# Patient Record
Sex: Male | Born: 1955 | Hispanic: No | Marital: Married | Smoking: Never smoker
Health system: Southern US, Community
[De-identification: ages and names within clinical notes are randomized; demographics above are authoritative.]

## PROBLEM LIST (undated history)

## (undated) DIAGNOSIS — T7840XA Allergy, unspecified, initial encounter: Secondary | ICD-10-CM

## (undated) HISTORY — DX: Allergy, unspecified, initial encounter: T78.40XA

---

## 2010-03-15 ENCOUNTER — Observation Stay (HOSPITAL_COMMUNITY)
Admission: EM | Admit: 2010-03-15 | Discharge: 2010-03-16 | Payer: Self-pay | Source: Home / Self Care | Attending: Family Medicine | Admitting: Family Medicine

## 2010-03-15 LAB — RAPID URINE DRUG SCREEN, HOSP PERFORMED
Amphetamines: NOT DETECTED
Barbiturates: NOT DETECTED
Benzodiazepines: NOT DETECTED
Cocaine: NOT DETECTED
Opiates: NOT DETECTED
Tetrahydrocannabinol: NOT DETECTED

## 2010-03-15 LAB — CBC
HCT: 42.4 % (ref 39.0–52.0)
Hemoglobin: 14.8 g/dL (ref 13.0–17.0)
MCH: 30.3 pg (ref 26.0–34.0)
MCHC: 34.9 g/dL (ref 30.0–36.0)
MCV: 86.7 fL (ref 78.0–100.0)
Platelets: 255 10*3/uL (ref 150–400)
RBC: 4.89 MIL/uL (ref 4.22–5.81)
RDW: 11.4 % — ABNORMAL LOW (ref 11.5–15.5)
WBC: 8.5 10*3/uL (ref 4.0–10.5)

## 2010-03-15 LAB — URINALYSIS, ROUTINE W REFLEX MICROSCOPIC
Bilirubin Urine: NEGATIVE
Hemoglobin, Urine: NEGATIVE
Ketones, ur: NEGATIVE mg/dL
Nitrite: NEGATIVE
Protein, ur: NEGATIVE mg/dL
Specific Gravity, Urine: 1.028 (ref 1.005–1.030)
Urine Glucose, Fasting: NEGATIVE mg/dL
Urobilinogen, UA: 0.2 mg/dL (ref 0.0–1.0)
pH: 6 (ref 5.0–8.0)

## 2010-03-15 LAB — POCT I-STAT, CHEM 8
BUN: 19 mg/dL (ref 6–23)
Calcium, Ion: 1.09 mmol/L — ABNORMAL LOW (ref 1.12–1.32)
Chloride: 107 mEq/L (ref 96–112)
Creatinine, Ser: 1.1 mg/dL (ref 0.4–1.5)
Glucose, Bld: 123 mg/dL — ABNORMAL HIGH (ref 70–99)
HCT: 43 % (ref 39.0–52.0)
Hemoglobin: 14.6 g/dL (ref 13.0–17.0)
Potassium: 4.3 mEq/L (ref 3.5–5.1)
Sodium: 140 mEq/L (ref 135–145)
TCO2: 26 mmol/L (ref 0–100)

## 2010-03-15 LAB — POCT CARDIAC MARKERS
CKMB, poc: 1.4 ng/mL (ref 1.0–8.0)
Myoglobin, poc: 73.2 ng/mL (ref 12–200)
Troponin i, poc: <0.05 ng/mL (ref 0.00–0.09)

## 2010-03-15 LAB — PROTIME-INR
INR: 0.94 (ref 0.00–1.49)
Prothrombin Time: 12.8 seconds (ref 11.6–15.2)

## 2010-03-15 LAB — TROPONIN I: Troponin I: 0.03 ng/mL (ref 0.00–0.06)

## 2010-03-15 LAB — DIFFERENTIAL
Basophils Absolute: 0 10*3/uL (ref 0.0–0.1)
Basophils Relative: 0 % (ref 0–1)
Eosinophils Absolute: 0 10*3/uL (ref 0.0–0.7)
Eosinophils Relative: 0 % (ref 0–5)
Lymphocytes Relative: 18 % (ref 12–46)
Lymphs Abs: 1.5 10*3/uL (ref 0.7–4.0)
Monocytes Absolute: 0.3 10*3/uL (ref 0.1–1.0)
Monocytes Relative: 3 % (ref 3–12)
Neutro Abs: 6.7 10*3/uL (ref 1.7–7.7)
Neutrophils Relative %: 79 % — ABNORMAL HIGH (ref 43–77)

## 2010-03-15 LAB — CK TOTAL AND CKMB (NOT AT ARMC)
CK, MB: 2.8 ng/mL (ref 0.3–4.0)
Relative Index: 1.5 (ref 0.0–2.5)
Total CK: 188 U/L (ref 7–232)

## 2010-03-16 LAB — HEPATIC FUNCTION PANEL
ALT: 29 U/L (ref 0–53)
AST: 27 U/L (ref 0–37)
Albumin: 3.7 g/dL (ref 3.5–5.2)
Alkaline Phosphatase: 71 U/L (ref 39–117)
Bilirubin, Direct: 0.1 mg/dL (ref 0.0–0.3)
Indirect Bilirubin: 0.3 mg/dL (ref 0.3–0.9)
Total Bilirubin: 0.4 mg/dL (ref 0.3–1.2)
Total Protein: 7 g/dL (ref 6.0–8.3)

## 2010-03-16 LAB — HEMOGLOBIN A1C
Hgb A1c MFr Bld: 6.5 % — ABNORMAL HIGH (ref <5.7)
Mean Plasma Glucose: 140 mg/dL — ABNORMAL HIGH (ref <117)

## 2010-03-16 LAB — LIPID PANEL
Cholesterol: 198 mg/dL (ref 0–200)
HDL: 38 mg/dL — ABNORMAL LOW (ref >39)
LDL Cholesterol: 119 mg/dL — ABNORMAL HIGH (ref 0–99)
Total CHOL/HDL Ratio: 5.2 RATIO
Triglycerides: 204 mg/dL — ABNORMAL HIGH (ref <150)
VLDL: 41 mg/dL — ABNORMAL HIGH (ref 0–40)

## 2010-03-16 LAB — TSH: TSH: 0.505 u[IU]/mL (ref 0.350–4.500)

## 2012-01-09 IMAGING — CR DG CHEST 2V
2 series · 2 of 2 positions shown · non-contrast
Comparison: None

CLINICAL DATA: Dizziness.

CHEST - 2 VIEW

[w chest pa]
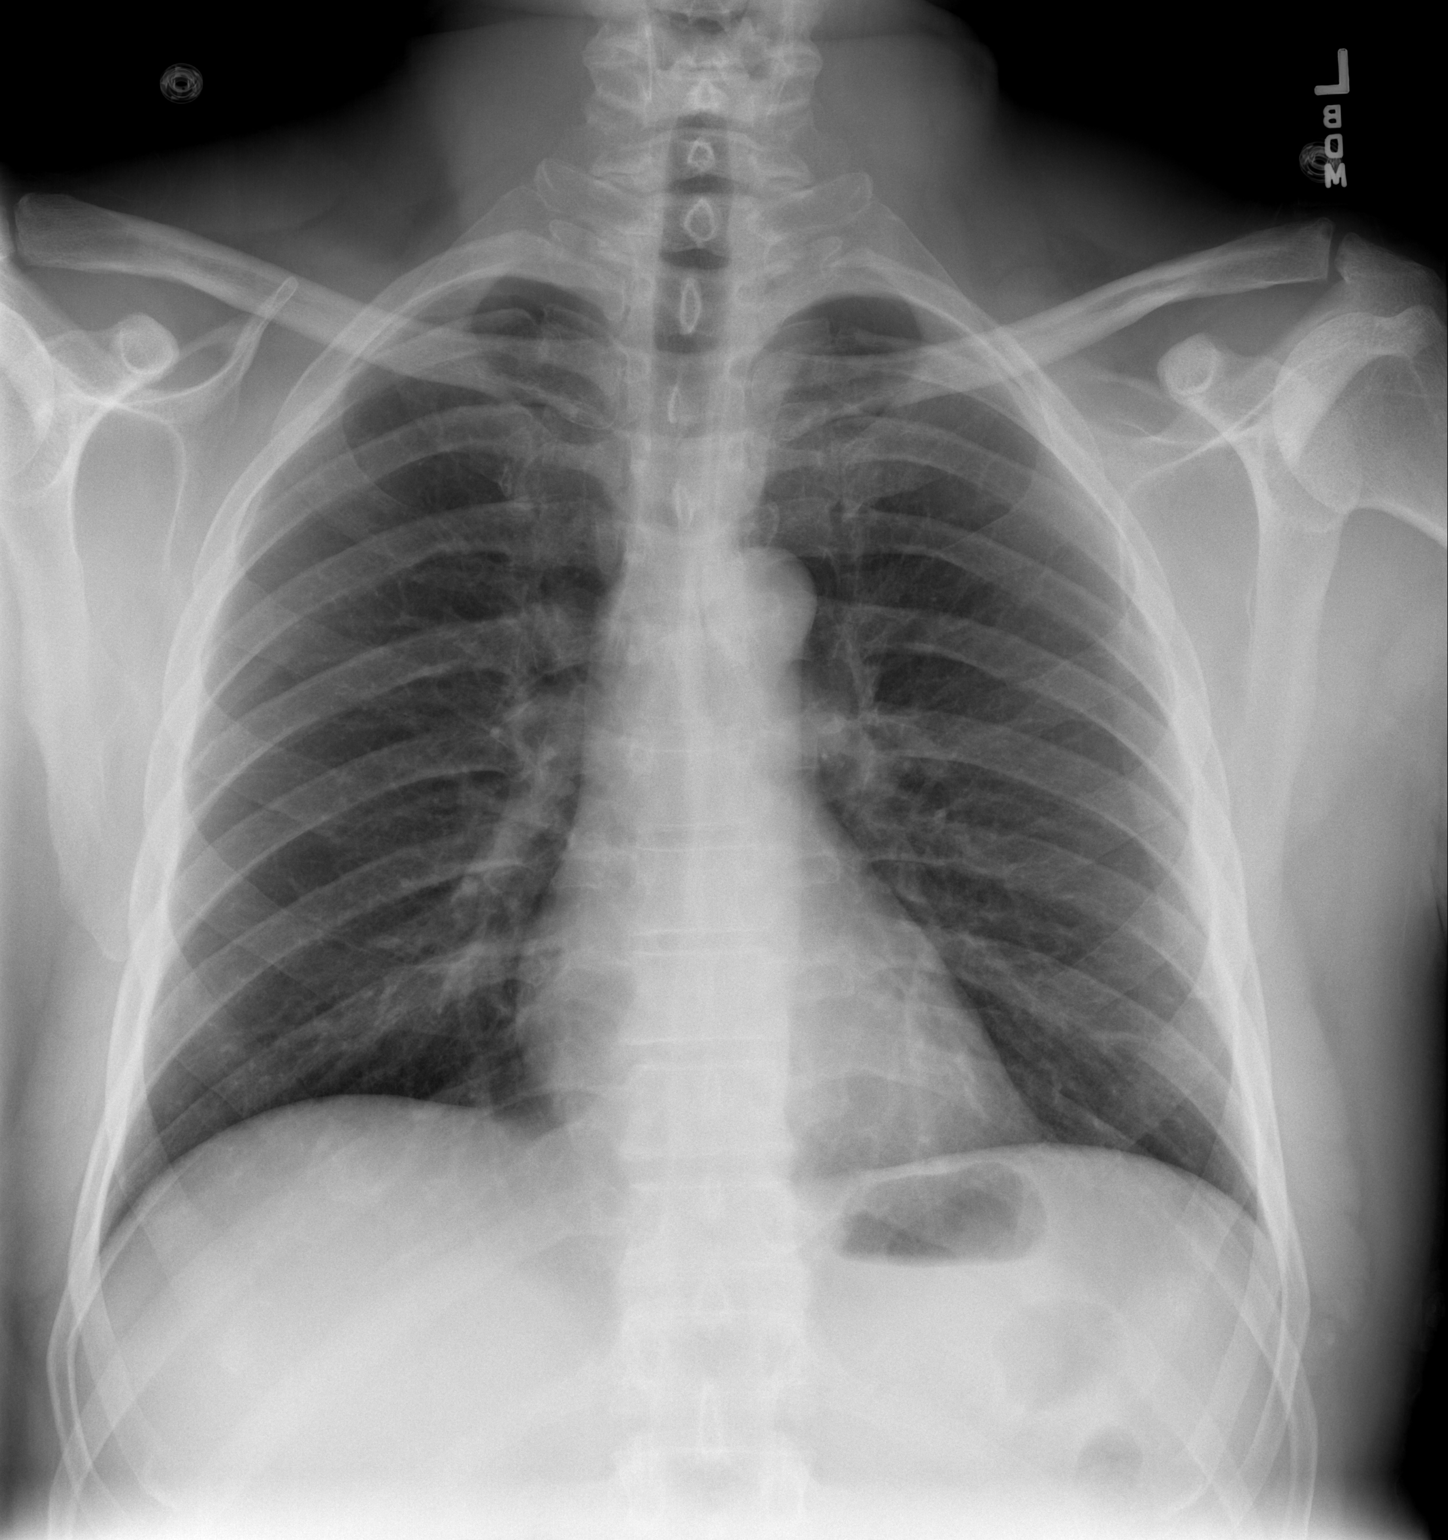

[w chest lat]
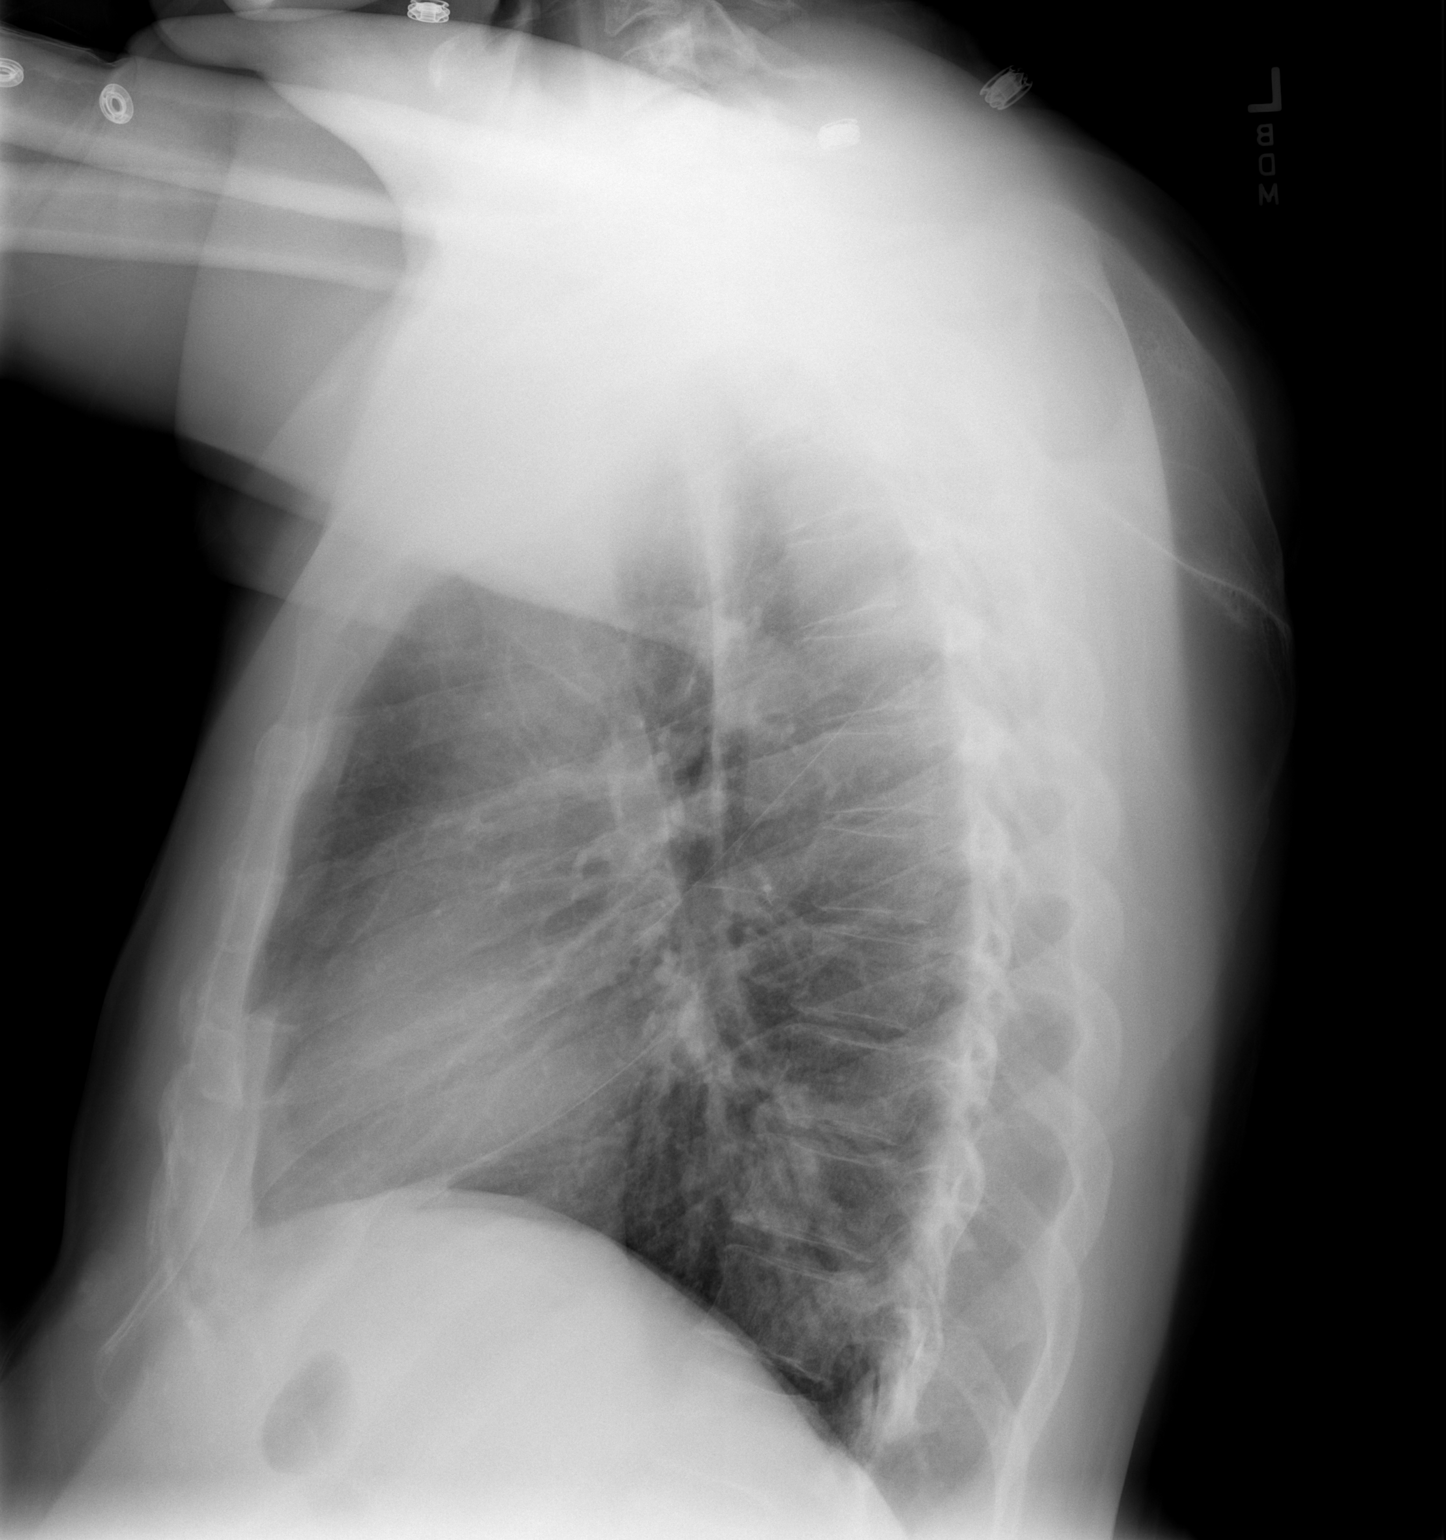

[2 of 2 positions shown; findings below may reference images not displayed]

FINDINGS: The cardiac silhouette, mediastinal and hilar contours
are within normal limits.  The lungs are clear.  The bony thorax is
intact.
IMPRESSION: No acute cardiopulmonary findings.

## 2012-01-09 IMAGING — CT CT HEAD W/O CM
1 series · 16 of 30 positions shown, 20 images · non-contrast
Comparison: None.

CLINICAL DATA: Dizziness

CT HEAD WITHOUT CONTRAST
TECHNIQUE: Contiguous axial images were obtained from the base of
the skull through the vertex without contrast.

[Series 2: head routine 4.8 h37s · axial · 0.43mm/px · z∈[-87,+67]mm · 16 of 36 slices shown, 20 images]
[im 2/36  brain]
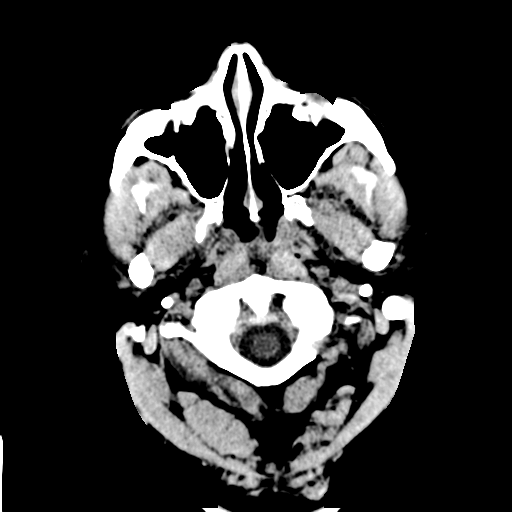
[im 2/36  bone]
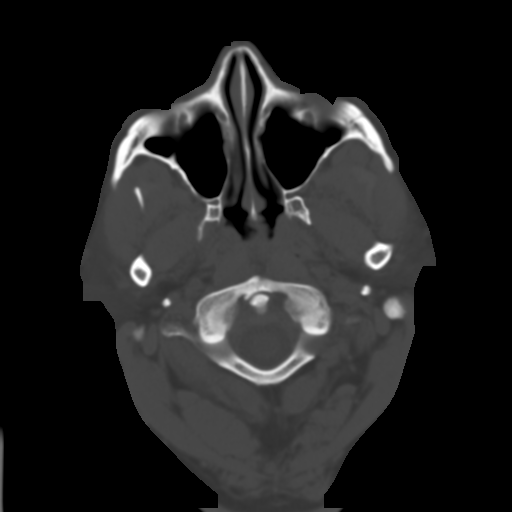
[im 4/36  brain]
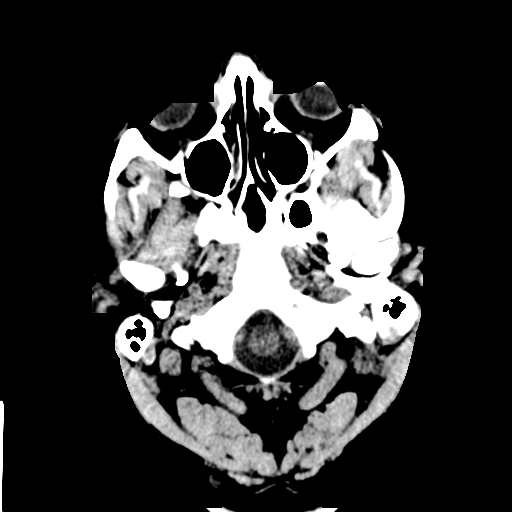
[im 7/36  brain]
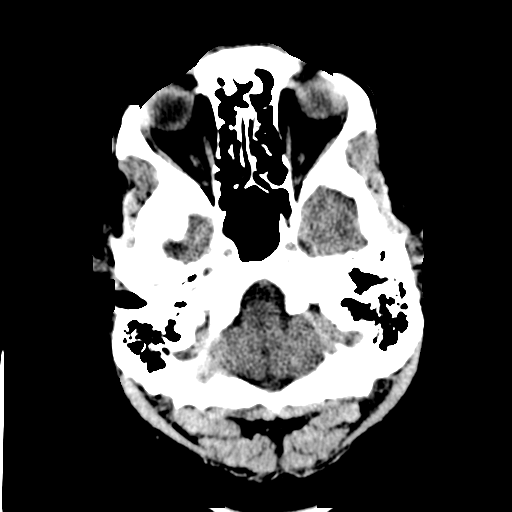
[im 9/36  brain]
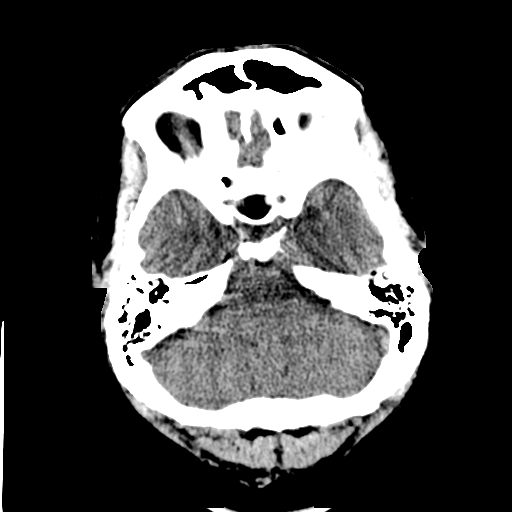
[im 10/36  brain]
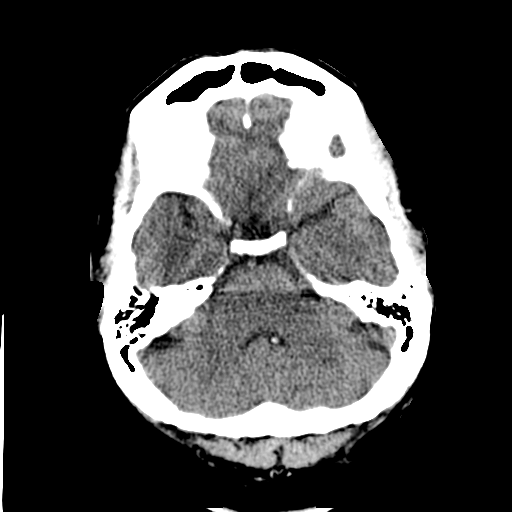
[im 10/36  bone]
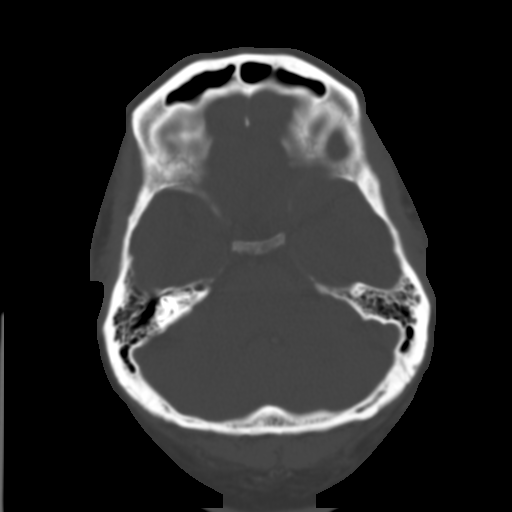
[im 13/36  brain]
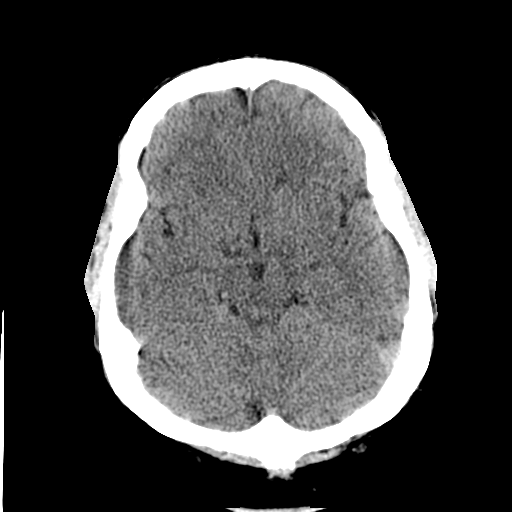
[im 15/36  brain]
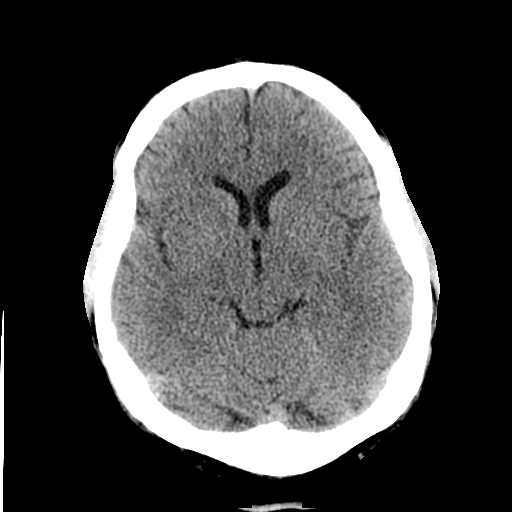
[im 17/36  brain]
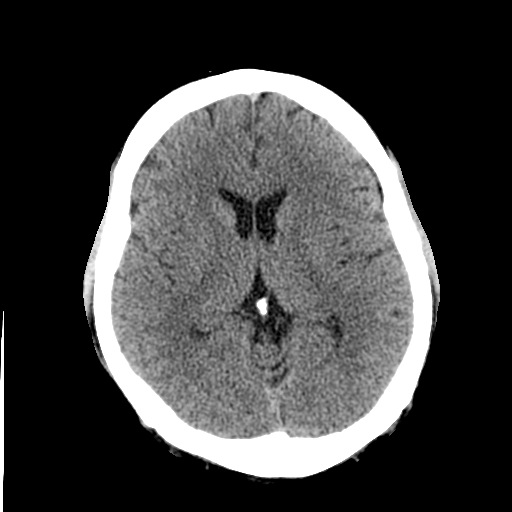
[im 19/36  brain]
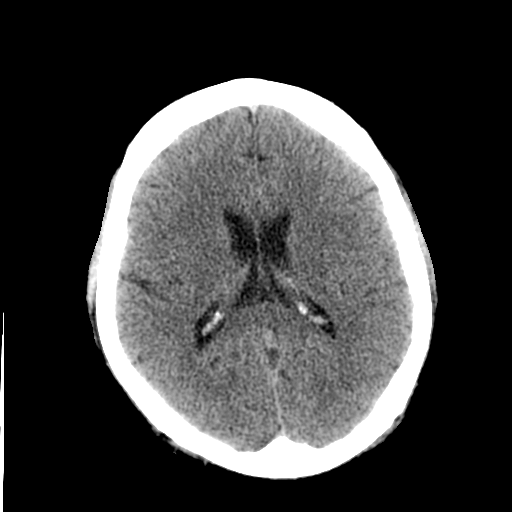
[im 19/36  bone]
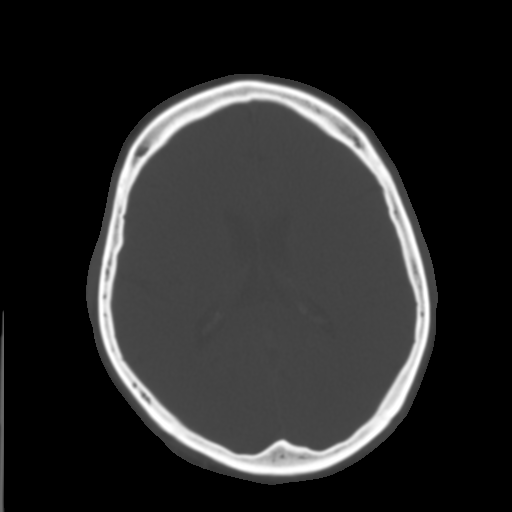
[im 21/36  brain]
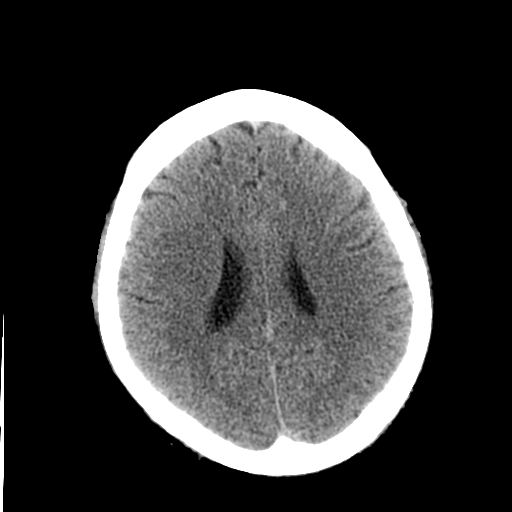
[im 23/36  brain]
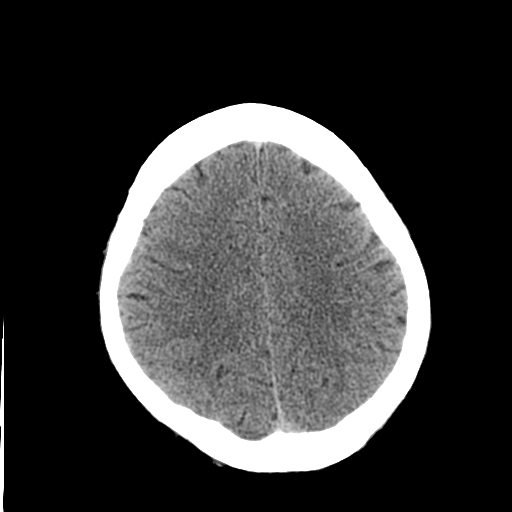
[im 26/36  brain]
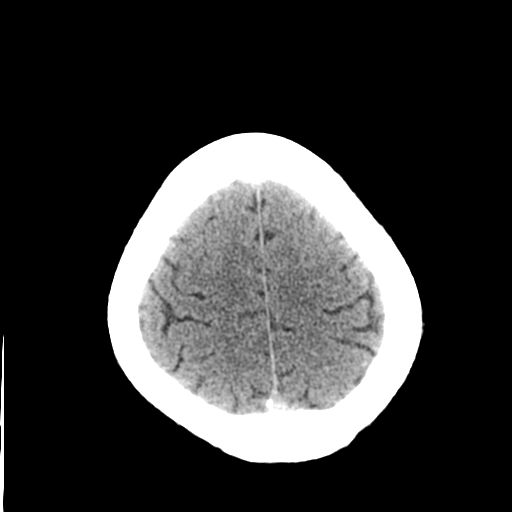
[im 27/36  brain]
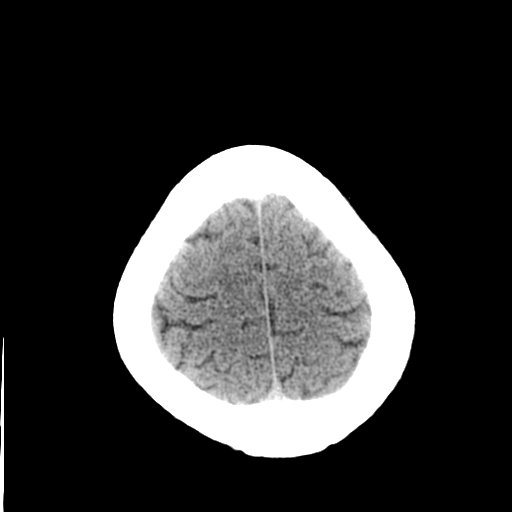
[im 27/36  bone]
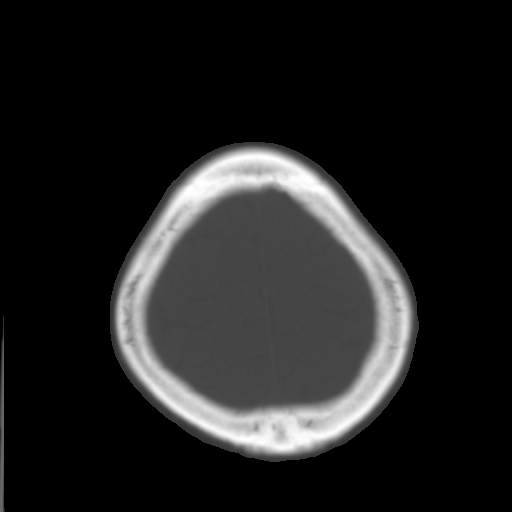
[im 29/36  brain]
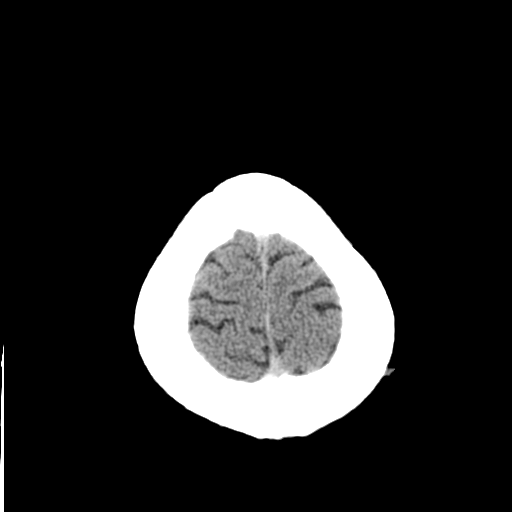
[im 32/36  brain]
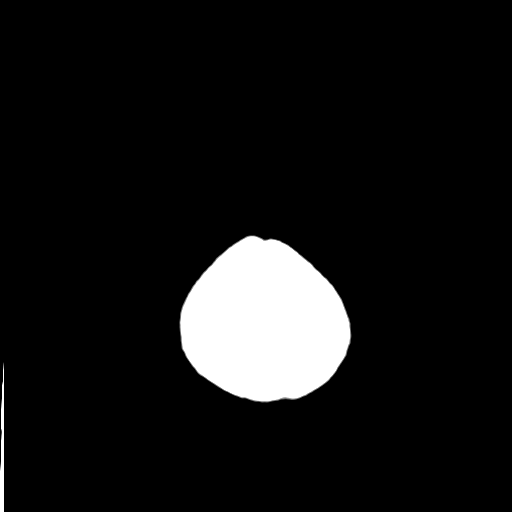
[im 34/36  brain]
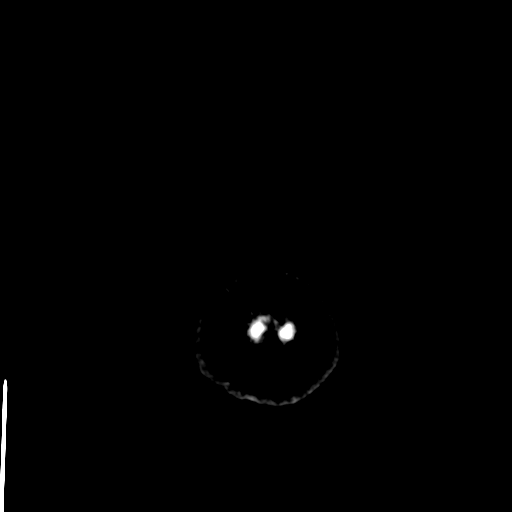

[16 of 30 positions shown; findings below may reference images not displayed]

FINDINGS: No intracranial hemorrhage.  No focal mass lesion.  No CT
evidence of acute infarction.  No midline shift or mass effect.  No
hydrocephalus.

Paranasal sinuses and mastoid air cells are clear.  Orbits are
normal.
IMPRESSION: Normal head CT

## 2012-07-29 ENCOUNTER — Ambulatory Visit (INDEPENDENT_AMBULATORY_CARE_PROVIDER_SITE_OTHER): Payer: BC Managed Care – PPO | Admitting: Family Medicine

## 2012-07-29 VITALS — BP 138/90 | HR 80 | Temp 98.3°F | Resp 18 | Ht 71.5 in | Wt 221.4 lb

## 2012-07-29 DIAGNOSIS — B356 Tinea cruris: Secondary | ICD-10-CM

## 2012-07-29 DIAGNOSIS — R21 Rash and other nonspecific skin eruption: Secondary | ICD-10-CM

## 2012-07-29 LAB — POCT CBC
Granulocyte percent: 68.5 %G (ref 37–80)
HCT, POC: 41.4 % — AB (ref 43.5–53.7)
Hemoglobin: 13.3 g/dL — AB (ref 14.1–18.1)
Lymph, poc: 1.6 (ref 0.6–3.4)
MCH, POC: 29.6 pg (ref 27–31.2)
MCHC: 32.1 g/dL (ref 31.8–35.4)
MCV: 92 fL (ref 80–97)
MID (cbc): 0.3 (ref 0–0.9)
MPV: 8.3 fL (ref 0–99.8)
POC Granulocyte: 4.2 (ref 2–6.9)
POC LYMPH PERCENT: 26.1 %L (ref 10–50)
POC MID %: 5.4 %M (ref 0–12)
Platelet Count, POC: 206 10*3/uL (ref 142–424)
RBC: 4.5 M/uL — AB (ref 4.69–6.13)
RDW, POC: 12.4 %
WBC: 6.1 10*3/uL (ref 4.6–10.2)

## 2012-07-29 LAB — COMPREHENSIVE METABOLIC PANEL
ALT: 35 U/L (ref 0–53)
AST: 26 U/L (ref 0–37)
Albumin: 4.4 g/dL (ref 3.5–5.2)
Alkaline Phosphatase: 73 U/L (ref 39–117)
BUN: 20 mg/dL (ref 6–23)
CO2: 29 mEq/L (ref 19–32)
Calcium: 9.3 mg/dL (ref 8.4–10.5)
Chloride: 103 mEq/L (ref 96–112)
Creat: 1.14 mg/dL (ref 0.50–1.35)
Glucose, Bld: 103 mg/dL — ABNORMAL HIGH (ref 70–99)
Potassium: 4.6 mEq/L (ref 3.5–5.3)
Sodium: 138 mEq/L (ref 135–145)
Total Bilirubin: 0.5 mg/dL (ref 0.3–1.2)
Total Protein: 7.3 g/dL (ref 6.0–8.3)

## 2012-07-29 LAB — POCT SKIN KOH: Skin KOH, POC: NEGATIVE

## 2012-07-29 MED ORDER — TERBINAFINE HCL 250 MG PO TABS: 250.0000 mg | ORAL_TABLET | Freq: Every day | ORAL | Status: DC

## 2012-07-29 MED ORDER — NYSTATIN-TRIAMCINOLONE 100000-0.1 UNIT/GM-% EX OINT: TOPICAL_OINTMENT | Freq: Two times a day (BID) | CUTANEOUS | Status: DC

## 2012-07-29 NOTE — Progress Notes (Signed)
[facility] [address] [place] [address] [phone]  Date:  07/29/2012   Name:  Craig Nelson   DOB:  1955-04-04   MRN:  [id number]  PCP:  No primary provider on file.    Chief Complaint: Rash   History of Present Illness:  Craig Nelson is a 57 y.o. very pleasant male patient who presents with the following:  He is here today with a rash in the groin area for 3 or 4 years.  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  The rash will wax and wane but never go away totally.    He has tried several OTC creams but has not really noted improvement.  No other rash, otherwise he has no symptoms such as dysuria or penile discharge.  He feels well otherwise and is generally healthy.     There are no active problems to display for this patient.   No past medical history on file.  No past surgical history on file.  History  Substance Use Topics  . Smoking status: Never Smoker   . Smokeless tobacco: Not on file  . Alcohol Use: No    No family history on file.  No Known Allergies  Medication list has been reviewed and updated.  No current outpatient prescriptions on file prior to visit.   No current facility-administered medications on file prior to visit.    Review of Systems:  As per HPI- otherwise negative.   Physical Examination: Filed Vitals:   07/29/12 1010  BP: 138/90  Pulse: 80  Temp: 98.3 F (36.8 C)  Resp: 18   Filed Vitals:   07/29/12 1010  Height: 5' 11.5" (1.816 m)  Weight: 221 lb 6.4 oz (100.426 kg)   Body mass index is 30.45 kg/(m^2). Ideal Body Weight: Weight in (lb) to have BMI = 25: 181.4  GEN: WDWN, NAD, Non-toxic, A & O x 3, looks well HEENT: Atraumatic, Normocephalic. Neck supple. No masses, No LAD. Ears and Nose: No external deformity. CV: RRR, No M/G/R. No JVD. No thrill. No extra heart sounds. PULM: CTA B, no wheezes, crackles, rhonchi. No  retractions. No resp. distress. No accessory muscle use. EXTR: No c/c/e NEURO Normal gait.  PSYCH: Normally interactive. Conversant. Not depressed or anxious appearing.  Calm demeanor.  Groin: he has circular, discrete lesions on his left groin and 2 on the left inner thigh which appear consistent with tinea  Results for orders placed in visit on 07/29/12  POCT SKIN KOH      Result Value Range   Skin KOH, POC Negative    POCT CBC      Result Value Range   WBC 6.1  4.6 - 10.2 K/uL   Lymph, poc 1.6  0.6 - 3.4   POC LYMPH PERCENT 26.1  10 - 50 %L   MID (cbc) 0.3  0 - 0.9   POC MID % 5.4  0 - 12 %M   POC Granulocyte 4.2  2 - 6.9   Granulocyte percent 68.5  37 - 80 %G   RBC 4.50 (*) 4.69 - 6.13 M/uL   Hemoglobin 13.3 (*) 14.1 - 18.1 g/dL   HCT, POC 29.5 (*) 62.1 - 53.7 %   MCV 92.0  80 - 97 fL   MCH, POC 29.6  27 - 31.2 pg   MCHC 32.1  31.8 - 35.4 g/dL  RDW, POC 12.4     Platelet Count, POC 206  142 - 424 K/uL   MPV 8.3  0 - 99.8 fL    Assessment and Plan: Rash and nonspecific skin eruption - Plan: POCT Skin KOH  Tinea cruris - Plan: terbinafine (LAMISIL) 250 MG tablet, nystatin-triamcinolone ointment (MYCOLOG), POCT CBC, Comprehensive metabolic panel  Persistent tinea infection- will try a 2 week course of oral lamisil, as well as mycolog cream.  Await labs to ensure he is save to continue terbinafine.   Minimal anemia- follow- up along with CMP Signed Abbe Amsterdam, MD

## 2012-07-29 NOTE — Patient Instructions (Addendum)
Take the terbinafine pill once a day for 2 weeks, and use the cream twice a day for about a week.  Let me know if you are not improved by the time you finish your treatment- Sooner if worse.

## 2012-07-30 ENCOUNTER — Encounter: Payer: Self-pay | Admitting: Family Medicine

## 2012-07-30 NOTE — Addendum Note (Signed)
Addended by: Abbe Amsterdam C on: 07/30/2012 05:40 PM   Modules accepted: Orders

## 2013-05-03 ENCOUNTER — Ambulatory Visit (INDEPENDENT_AMBULATORY_CARE_PROVIDER_SITE_OTHER): Payer: BC Managed Care – PPO | Admitting: Family Medicine

## 2013-05-03 ENCOUNTER — Ambulatory Visit: Payer: BC Managed Care – PPO

## 2013-05-03 VITALS — BP 130/82 | HR 75 | Temp 98.0°F | Resp 16 | Ht 71.5 in | Wt 225.2 lb

## 2013-05-03 DIAGNOSIS — M722 Plantar fascial fibromatosis: Secondary | ICD-10-CM

## 2013-05-03 DIAGNOSIS — H938X2 Other specified disorders of left ear: Secondary | ICD-10-CM

## 2013-05-03 DIAGNOSIS — H938X9 Other specified disorders of ear, unspecified ear: Secondary | ICD-10-CM

## 2013-05-03 DIAGNOSIS — M25579 Pain in unspecified ankle and joints of unspecified foot: Secondary | ICD-10-CM

## 2013-05-03 DIAGNOSIS — R05 Cough: Secondary | ICD-10-CM

## 2013-05-03 DIAGNOSIS — R059 Cough, unspecified: Secondary | ICD-10-CM

## 2013-05-03 MED ORDER — HYDROCODONE-HOMATROPINE 5-1.5 MG/5ML PO SYRP: 5.0000 mL | ORAL_SOLUTION | Freq: Every evening | ORAL | Status: DC | PRN

## 2013-05-03 MED ORDER — BENZONATATE 100 MG PO CAPS: 200.0000 mg | ORAL_CAPSULE | Freq: Two times a day (BID) | ORAL | Status: DC | PRN

## 2013-05-03 NOTE — Patient Instructions (Signed)
Plantar Fasciitis (Heel Spur Syndrome) with Rehab The plantar fascia is a fibrous, ligament-like, soft-tissue structure that spans the bottom of the foot. Plantar fasciitis is a condition that causes pain in the foot due to inflammation of the tissue. SYMPTOMS   Pain and tenderness on the underneath side of the foot.  Pain that worsens with standing or walking. CAUSES  Plantar fasciitis is caused by irritation and injury to the plantar fascia on the underneath side of the foot. Common mechanisms of injury include:  Direct trauma to bottom of the foot.  Damage to a small nerve that runs under the foot where the main fascia attaches to the heel bone.  Stress placed on the plantar fascia due to bone spurs. RISK INCREASES WITH:   Activities that place stress on the plantar fascia (running, jumping, pivoting, or cutting).  Poor strength and flexibility.  Improperly fitted shoes.  Tight calf muscles.  Flat feet.  Failure to warm-up properly before activity.  Obesity. PREVENTION  Warm up and stretch properly before activity.  Allow for adequate recovery between workouts.  Maintain physical fitness:  Strength, flexibility, and endurance.  Cardiovascular fitness.  Maintain a health body weight.  Avoid stress on the plantar fascia.  Wear properly fitted shoes, including arch supports for individuals who have flat feet. PROGNOSIS  If treated properly, then the symptoms of plantar fasciitis usually resolve without surgery. However, occasionally surgery is necessary. RELATED COMPLICATIONS   Recurrent symptoms that may result in a chronic condition.  Problems of the lower back that are caused by compensating for the injury, such as limping.  Pain or weakness of the foot during push-off following surgery.  Chronic inflammation, scarring, and partial or complete fascia tear, occurring more often from repeated injections. TREATMENT  Treatment initially involves the use of  ice and medication to help reduce pain and inflammation. The use of strengthening and stretching exercises may help reduce pain with activity, especially stretches of the Achilles tendon. These exercises may be performed at home or with a therapist. Your caregiver may recommend that you use heel cups of arch supports to help reduce stress on the plantar fascia. Occasionally, corticosteroid injections are given to reduce inflammation. If symptoms persist for greater than 6 months despite non-surgical (conservative), then surgery may be recommended.  MEDICATION   If pain medication is necessary, then nonsteroidal anti-inflammatory medications, such as aspirin and ibuprofen, or other minor pain relievers, such as acetaminophen, are often recommended.  Do not take pain medication within 7 days before surgery.  Prescription pain relievers may be given if deemed necessary by your caregiver. Use only as directed and only as much as you need.  Corticosteroid injections may be given by your caregiver. These injections should be reserved for the most serious cases, because they may only be given a certain number of times. HEAT AND COLD  Cold treatment (icing) relieves pain and reduces inflammation. Cold treatment should be applied for 10 to 15 minutes every 2 to 3 hours for inflammation and pain and immediately after any activity that aggravates your symptoms. Use ice packs or massage the area with a piece of ice (ice massage).  Heat treatment may be used prior to performing the stretching and strengthening activities prescribed by your caregiver, physical therapist, or athletic trainer. Use a heat pack or soak the injury in warm water. SEEK IMMEDIATE MEDICAL CARE IF:  Treatment seems to offer no benefit, or the condition worsens.  Any medications produce adverse side effects. EXERCISES RANGE   OF MOTION (ROM) AND STRETCHING EXERCISES - Plantar Fasciitis (Heel Spur Syndrome) These exercises may help you  when beginning to rehabilitate your injury. Your symptoms may resolve with or without further involvement from your physician, physical therapist or athletic trainer. While completing these exercises, remember:   Restoring tissue flexibility helps normal motion to return to the joints. This allows healthier, less painful movement and activity.  An effective stretch should be held for at least 30 seconds.  A stretch should never be painful. You should only feel a gentle lengthening or release in the stretched tissue. RANGE OF MOTION - Toe Extension, Flexion  Sit with your right / left leg crossed over your opposite knee.  Grasp your toes and gently pull them back toward the top of your foot. You should feel a stretch on the bottom of your toes and/or foot.  Hold this stretch for __________ seconds.  Now, gently pull your toes toward the bottom of your foot. You should feel a stretch on the top of your toes and or foot.  Hold this stretch for __________ seconds. Repeat __________ times. Complete this stretch __________ times per day.  RANGE OF MOTION - Ankle Dorsiflexion, Active Assisted  Remove shoes and sit on a chair that is preferably not on a carpeted surface.  Place right / left foot under knee. Extend your opposite leg for support.  Keeping your heel down, slide your right / left foot back toward the chair until you feel a stretch at your ankle or calf. If you do not feel a stretch, slide your bottom forward to the edge of the chair, while still keeping your heel down.  Hold this stretch for __________ seconds. Repeat __________ times. Complete this stretch __________ times per day.  STRETCH  Gastroc, Standing  Place hands on wall.  Extend right / left leg, keeping the front knee somewhat bent.  Slightly point your toes inward on your back foot.  Keeping your right / left heel on the floor and your knee straight, shift your weight toward the wall, not allowing your back to  arch.  You should feel a gentle stretch in the right / left calf. Hold this position for __________ seconds. Repeat __________ times. Complete this stretch __________ times per day. STRETCH  Soleus, Standing  Place hands on wall.  Extend right / left leg, keeping the other knee somewhat bent.  Slightly point your toes inward on your back foot.  Keep your right / left heel on the floor, bend your back knee, and slightly shift your weight over the back leg so that you feel a gentle stretch deep in your back calf.  Hold this position for __________ seconds. Repeat __________ times. Complete this stretch __________ times per day. STRETCH  Gastrocsoleus, Standing  Note: This exercise can place a lot of stress on your foot and ankle. Please complete this exercise only if specifically instructed by your caregiver.   Place the ball of your right / left foot on a step, keeping your other foot firmly on the same step.  Hold on to the wall or a rail for balance.  Slowly lift your other foot, allowing your body weight to press your heel down over the edge of the step.  You should feel a stretch in your right / left calf.  Hold this position for __________ seconds.  Repeat this exercise with a slight bend in your right / left knee. Repeat __________ times. Complete this stretch __________ times per day.    STRENGTHENING EXERCISES - Plantar Fasciitis (Heel Spur Syndrome)  These exercises may help you when beginning to rehabilitate your injury. They may resolve your symptoms with or without further involvement from your physician, physical therapist or athletic trainer. While completing these exercises, remember:   Muscles can gain both the endurance and the strength needed for everyday activities through controlled exercises.  Complete these exercises as instructed by your physician, physical therapist or athletic trainer. Progress the resistance and repetitions only as guided. STRENGTH - Towel  Curls  Sit in a chair positioned on a non-carpeted surface.  Place your foot on a towel, keeping your heel on the floor.  Pull the towel toward your heel by only curling your toes. Keep your heel on the floor.  If instructed by your physician, physical therapist or athletic trainer, add ____________________ at the end of the towel. Repeat __________ times. Complete this exercise __________ times per day. STRENGTH - Ankle Inversion  Secure one end of a rubber exercise band/tubing to a fixed object (table, pole). Loop the other end around your foot just before your toes.  Place your fists between your knees. This will focus your strengthening at your ankle.  Slowly, pull your big toe up and in, making sure the band/tubing is positioned to resist the entire motion.  Hold this position for __________ seconds.  Have your muscles resist the band/tubing as it slowly pulls your foot back to the starting position. Repeat __________ times. Complete this exercises __________ times per day.  Document Released: 02/25/2005 Document Revised: 05/20/2011 Document Reviewed: 06/09/2008 [employer] Patient Information 2014 [employer]. Plantar Fasciitis Plantar fasciitis is a common condition that causes foot pain. It is soreness (inflammation) of the band of tough fibrous tissue on the bottom of the foot that runs from the heel bone (calcaneus) to the ball of the foot. The cause of this soreness may be from excessive standing, poor fitting shoes, running on hard surfaces, being overweight, having an abnormal walk, or overuse (this is common in runners) of the painful foot or feet. It is also common in aerobic exercise dancers and ballet dancers. SYMPTOMS  Most people with plantar fasciitis complain of:  Severe pain in the morning on the bottom of their foot especially when taking the first steps out of bed. This pain recedes after a few minutes of walking.  Severe pain is experienced also during walking  following a long period of inactivity.  Pain is worse when walking barefoot or up stairs DIAGNOSIS   Your caregiver will diagnose this condition by examining and feeling your foot.  Special tests such as X-rays of your foot, are usually not needed. PREVENTION   Consult a sports medicine professional before beginning a new exercise program.  Walking programs offer a good workout. With walking there is a lower chance of overuse injuries common to runners. There is less impact and less jarring of the joints.  Begin all new exercise programs slowly. If problems or pain develop, decrease the amount of time or distance until you are at a comfortable level.  Wear good shoes and replace them regularly.  Stretch your foot and the heel cords at the back of the ankle (Achilles tendon) both before and after exercise.  Run or exercise on even surfaces that are not hard. For example, asphalt is better than pavement.  Do not run barefoot on hard surfaces.  If using a treadmill, vary the incline.  Do not continue to workout if you have foot or joint   problems. Seek professional help if they do not improve. HOME CARE INSTRUCTIONS   Avoid activities that cause you pain until you recover.  Use ice or cold packs on the problem or painful areas after working out.  Only take over-the-counter or prescription medicines for pain, discomfort, or fever as directed by your caregiver.  Soft shoe inserts or athletic shoes with air or gel sole cushions may be helpful.  If problems continue or become more severe, consult a sports medicine caregiver or your own health care provider. Cortisone is a potent anti-inflammatory medication that may be injected into the painful area. You can discuss this treatment with your caregiver. MAKE SURE YOU:   Understand these instructions.  Will watch your condition.  Will get help right away if you are not doing well or get worse. Document Released: 11/20/2000 Document  Revised: 05/20/2011 Document Reviewed: 01/20/2008 [employer] Patient Information 2014 [employer].  

## 2013-05-03 NOTE — Progress Notes (Signed)
 Chief Complaint:  Chief Complaint  Patient presents with  . Cough    x3 days, "productive"  . Foot Pain    R heel pain x2 years, worse recently   . Ear Fullness    "clogged", L ear x6-7 months    HPI: Craig Nelson is a 58 y.o. male who is here for : 1. Cough x 3 days , productive, mucus, nonsmoker, no sinus pain or problems. No fevers or chills. No sick contact contacts. No facial pain. NO SOB or CP . No sore throat except when he coughs, does not feel he has PND.  Denies allergies or asthma.   2. Right heel pain x 5 years , has pain with walking. No prior injuires. 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 Has tried insoles without relief. He has some morning pain, the heel pain radiates down the middle to his toes, deneis numbness or tingling. He has pain in the AM and worse with barefeet. He walks at home without shoes since he has carpet. Has not tried anything for this.  3. Left ear drum feels there is water in it. He denies any pain or loss of hearing or ringing.   History reviewed. No pertinent past medical history. History reviewed. No pertinent past surgical history. History   Social History  . Marital Status: Married    Spouse Name: N/A    Number of Children: N/A  . Years of Education: N/A   Social History Main Topics  . Smoking status: Never Smoker   . Smokeless tobacco: None  . Alcohol Use: No  . Drug Use: No  . Sexual Activity: Yes   Other Topics Concern  . None   Social History Narrative  . None   History reviewed. No pertinent family history. No Known Allergies Prior to Admission medications   Medication Sig Start Date End Date Taking? Authorizing Provider  nystatin-triamcinolone ointment  (MYCOLOG) Apply topically 2 (two) times daily. 07/29/12   Gwenlyn Found Copland, MD  terbinafine (LAMISIL) 250 MG tablet Take 1 tablet (250 mg total) by mouth daily. 07/29/12   Gwenlyn Found Copland, MD     ROS: The patient denies fevers, chills, night sweats, unintentional weight loss, chest pain, palpitations, wheezing, dyspnea on exertion, nausea, vomiting, abdominal pain, dysuria, hematuria, melena, numbness, weakness, or tingling.   All other systems have been reviewed and were otherwise negative with the exception of those mentioned in the HPI and as above.    PHYSICAL EXAM: Filed Vitals:   05/03/13 1641  BP: 130/82  Pulse: 75  Temp: 98 F (36.7 C)  Resp: 16   Filed Vitals:   05/03/13 1641  Height: 5' 11.5" (1.816 m)  Weight: 225 lb 3.2 oz (102.15 kg)   Body mass index is 30.97 kg/(m^2).  General: Alert, no acute distress HEENT:  Normocephalic, atraumatic, oropharynx patent. EOMI, PERRLA. Left TM minimal cerumen and hair, disimpacted. TM nl. Erythematous throat, no exudates, no sinus tenderness Cardiovascular:  Regular rate and rhythm, no rubs murmurs or gallops.  No Carotid bruits, radial pulse intact. No pedal edema.  Respiratory: Clear to auscultation bilaterally.  No wheezes, rales, or  rhonchi.  No cyanosis, no use of accessory musculature GI: No organomegaly, abdomen is soft and non-tender, positive bowel sounds.  No masses. Skin: No rashes. Neurologic: Facial musculature symmetric. Psychiatric: Patient is appropriate throughout our interaction. Lymphatic: No cervical lymphadenopathy Musculoskeletal: Gait intact. Right foot-no defomrities, 5/5 strength, 2/2 ankle DTR, sensation intact   LABS:    EKG/XRAY:   [facility] Neg fracture or dislocation  ASSESSMENT/PLAN: Encounter Diagnoses  Name Primary?  . Cough Yes  . Pain in joint, ankle and foot   . Congestion of left ear   . Plantar fasciitis    Rx hycodan Rx Tessalon perles Sx  treamtent, if worse he can call me. I think he is having early sxs of possible viral URI Gave exercise for PF, advise to wear shoes at home at all times He felt better after cerumen disimpaction F/u prn  Gross sideeffects, risk and benefits, and alternatives of medications d/w patient. Patient is aware that all medications have potential sideeffects and we are unable to predict every sideeffect or drug-drug interaction that may occur.  ,  PHUONG, DO 05/04/2013 2:04 PM

## 2013-05-06 ENCOUNTER — Telehealth: Payer: Self-pay | Admitting: Family Medicine

## 2013-05-06 DIAGNOSIS — J069 Acute upper respiratory infection, unspecified: Secondary | ICD-10-CM

## 2013-05-06 MED ORDER — AMOXICILLIN-POT CLAVULANATE 875-125 MG PO TABS: 1.0000 | ORAL_TABLET | Freq: Two times a day (BID) | ORAL | Status: DC

## 2013-05-06 NOTE — Telephone Encounter (Signed)
Spoke with wife; pt evaluated by Dr. Conley RollsLe three days ago.  +cough hard; medication not effective. Upon review of note, prescribed Hycodan and Tessalon Perles.  Advised to contact Dr.Le if medication is not effective.

## 2013-05-06 NOTE — Telephone Encounter (Signed)
spoke with patient's wife, she states he is doing worse, no improvement with meds. He is coughing more, no SOb or wheezing has CP with coughing only. No fevers or chills. Will call in Augmentin BID x 10 days. Gross sideeffects, risk and benefits, and alternatives of medications d/w patient. Patient is aware that all medications have potential sideeffects and we are unable to predict every sideeffect or drug-drug interaction that may occur.

## 2013-09-14 ENCOUNTER — Ambulatory Visit (INDEPENDENT_AMBULATORY_CARE_PROVIDER_SITE_OTHER): Payer: BC Managed Care – PPO | Admitting: Emergency Medicine

## 2013-09-14 VITALS — BP 139/87 | HR 91 | Temp 97.4°F | Resp 16 | Ht 71.5 in | Wt 224.6 lb

## 2013-09-14 DIAGNOSIS — H8110 Benign paroxysmal vertigo, unspecified ear: Secondary | ICD-10-CM

## 2013-09-14 MED ORDER — ONDANSETRON 4 MG PO TBDP
8.0000 mg | ORAL_TABLET | Freq: Once | ORAL | Status: AC
  Administered 2013-09-14: 8 mg via ORAL

## 2013-09-14 MED ORDER — MECLIZINE HCL 50 MG PO TABS: 50.0000 mg | ORAL_TABLET | Freq: Three times a day (TID) | ORAL | Status: DC | PRN

## 2013-09-14 NOTE — Patient Instructions (Signed)
Benign Positional Vertigo Vertigo means you feel like you or your surroundings are moving when they are not. Benign positional vertigo is the most common form of vertigo. Benign means that the cause of your condition is not serious. Benign positional vertigo is more common in older adults. CAUSES  Benign positional vertigo is the result of an upset in the labyrinth system. This is an area in the middle ear that helps control your balance. This may be caused by a viral infection, head injury, or repetitive motion. However, often no specific cause is found. SYMPTOMS  Symptoms of benign positional vertigo occur when you move your head or eyes in different directions. Some of the symptoms may include:  Loss of balance and falls.  Vomiting.  Blurred vision.  Dizziness.  Nausea.  Involuntary eye movements (nystagmus). DIAGNOSIS  Benign positional vertigo is usually diagnosed by physical exam. If the specific cause of your benign positional vertigo is unknown, your caregiver may perform imaging tests, such as magnetic resonance imaging (MRI) or computed tomography (CT). TREATMENT  Your caregiver may recommend movements or procedures to correct the benign positional vertigo. Medicines such as meclizine, benzodiazepines, and medicines for nausea may be used to treat your symptoms. In rare cases, if your symptoms are caused by certain conditions that affect the inner ear, you may need surgery. HOME CARE INSTRUCTIONS   Follow your caregiver's instructions.  Move slowly. Do not make sudden body or head movements.  Avoid driving.  Avoid operating heavy machinery.  Avoid performing any tasks that would be dangerous to you or others during a vertigo episode.  Drink enough fluids to keep your urine clear or pale yellow. SEEK IMMEDIATE MEDICAL CARE IF:   You develop problems with walking, weakness, numbness, or using your arms, hands, or legs.  You have difficulty speaking.  You develop  severe headaches.  Your nausea or vomiting continues or gets worse.  You develop visual changes.  Your family or friends notice any behavioral changes.  Your condition gets worse.  You have a fever.  You develop a stiff neck or sensitivity to light. MAKE SURE YOU:   Understand these instructions.  Will watch your condition.  Will get help right away if you are not doing well or get worse. Document Released: 12/03/2005 Document Revised: 05/20/2011 Document Reviewed: 11/15/2010 ExitCare Patient Information 2015 [employer]. This information is not intended to replace advice given to you by your health care provider. Make sure you discuss any questions you have with your health care provider.    

## 2013-09-14 NOTE — Progress Notes (Signed)
[facility] [address], [place] [id number] [phone]  Date:  09/14/2013   Name:  Craig Nelson Bearden   DOB:  1955/09/01   MRN:  [id number]  PCP:  No PCP Per Patient    Chief Complaint: Dizziness   History of Present Illness:  Craig Nelson Sans is a 58 y.o. very pleasant male patient who presents with the following:  Awoke this morning with vertigo and nausea but no vomiting.  No fever or chills or stool change.  No head injury.  No antecedent illness, coryza or cough.   No neuro or visual symptoms or headache.  Prior episode of same.  No improvement with over the counter medications or other home remedies.  Denies other complaint or health concern today.   There are no active problems to display for this patient.   History reviewed. No pertinent past medical history.  History reviewed. No pertinent past surgical history.  History  Substance Use Topics  . Smoking status: Never Smoker   . Smokeless tobacco: Not on file  . Alcohol Use: No    Family History  Problem Relation Age of Onset  . Family history unknown: Yes    No Known Allergies  Medication list has been reviewed and updated.  No current outpatient prescriptions on file prior to visit.   No current facility-administered medications on file prior to visit.    Review of Systems:  As per HPI, otherwise negative.    Physical Examination: Filed Vitals:   09/14/13 1020  BP: 139/87  Pulse: 91  Temp: 97.4 F (36.3 C)  Resp: 16   Filed Vitals:   09/14/13 1020  Height: 5' 11.5" (1.816 m)  Weight: 224 lb 9.6 oz (101.878 kg)   Body mass index is 30.89 kg/(m^2). Ideal Body Weight: Weight in (lb) to have BMI = 25: 181.4  GEN: WDWN, NAD, Non-toxic, A & O x 3 HEENT: Atraumatic, Normocephalic. Neck supple. No masses, No LAD. Ears and Nose: No external deformity. CV: RRR, No M/G/R. No JVD. No thrill. No extra heart sounds. PULM: CTA B, no wheezes, crackles, rhonchi. No retractions. No resp.  distress. No accessory muscle use. ABD: S, NT, ND, +BS. No rebound. No HSM. EXTR: No c/c/e NEURO Normal gait.  PSYCH: Normally interactive. Conversant. Not depressed or anxious appearing.  Calm demeanor.    Assessment and Plan: Benign positional vertigo antivert  Signed,  Phillips OdorJeffery Shannie Kontos, MD

## 2014-04-06 ENCOUNTER — Ambulatory Visit (INDEPENDENT_AMBULATORY_CARE_PROVIDER_SITE_OTHER): Payer: 59 | Admitting: Family Medicine

## 2014-04-06 VITALS — BP 142/86 | HR 91 | Temp 98.0°F | Resp 18 | Ht 71.5 in | Wt 258.6 lb

## 2014-04-06 DIAGNOSIS — Z23 Encounter for immunization: Secondary | ICD-10-CM

## 2014-04-06 DIAGNOSIS — N529 Male erectile dysfunction, unspecified: Secondary | ICD-10-CM | POA: Insufficient documentation

## 2014-04-06 DIAGNOSIS — H8110 Benign paroxysmal vertigo, unspecified ear: Secondary | ICD-10-CM

## 2014-04-06 DIAGNOSIS — R635 Abnormal weight gain: Secondary | ICD-10-CM

## 2014-04-06 DIAGNOSIS — Z125 Encounter for screening for malignant neoplasm of prostate: Secondary | ICD-10-CM

## 2014-04-06 DIAGNOSIS — Z0000 Encounter for general adult medical examination without abnormal findings: Secondary | ICD-10-CM

## 2014-04-06 DIAGNOSIS — E669 Obesity, unspecified: Secondary | ICD-10-CM

## 2014-04-06 DIAGNOSIS — N528 Other male erectile dysfunction: Secondary | ICD-10-CM

## 2014-04-06 LAB — COMPREHENSIVE METABOLIC PANEL
ALT: 42 U/L (ref 0–53)
AST: 32 U/L (ref 0–37)
Albumin: 4.3 g/dL (ref 3.5–5.2)
Alkaline Phosphatase: 81 U/L (ref 39–117)
BUN: 24 mg/dL — ABNORMAL HIGH (ref 6–23)
CO2: 27 mEq/L (ref 19–32)
Calcium: 9 mg/dL (ref 8.4–10.5)
Chloride: 103 mEq/L (ref 96–112)
Creat: 0.98 mg/dL (ref 0.50–1.35)
Glucose, Bld: 126 mg/dL — ABNORMAL HIGH (ref 70–99)
Potassium: 4.3 mEq/L (ref 3.5–5.3)
Sodium: 137 mEq/L (ref 135–145)
Total Bilirubin: 0.6 mg/dL (ref 0.2–1.2)
Total Protein: 7.4 g/dL (ref 6.0–8.3)

## 2014-04-06 LAB — TSH: TSH: 2.49 u[IU]/mL (ref 0.350–4.500)

## 2014-04-06 LAB — LIPID PANEL
Cholesterol: 216 mg/dL — ABNORMAL HIGH (ref 0–200)
HDL: 44 mg/dL (ref >39)
LDL Cholesterol: 104 mg/dL — ABNORMAL HIGH (ref 0–99)
Total CHOL/HDL Ratio: 4.9 Ratio
Triglycerides: 339 mg/dL — ABNORMAL HIGH (ref <150)
VLDL: 68 mg/dL — ABNORMAL HIGH (ref 0–40)

## 2014-04-06 LAB — CBC
HCT: 44.7 % (ref 39.0–52.0)
Hemoglobin: 15.6 g/dL (ref 13.0–17.0)
MCH: 29.9 pg (ref 26.0–34.0)
MCHC: 34.9 g/dL (ref 30.0–36.0)
MCV: 85.6 fL (ref 78.0–100.0)
MPV: 9.5 fL (ref 8.6–12.4)
Platelets: 273 10*3/uL (ref 150–400)
RBC: 5.22 MIL/uL (ref 4.22–5.81)
RDW: 12.5 % (ref 11.5–15.5)
WBC: 6.9 10*3/uL (ref 4.0–10.5)

## 2014-04-06 MED ORDER — TADALAFIL 5 MG PO TABS: ORAL_TABLET | ORAL | Status: DC

## 2014-04-06 MED ORDER — MECLIZINE HCL 50 MG PO TABS
50.0000 mg | ORAL_TABLET | Freq: Two times a day (BID) | ORAL | Status: DC | PRN
Start: 2014-04-06 — End: 2017-12-29

## 2014-04-06 NOTE — Progress Notes (Signed)
[facility] [address], [place] [address] [phone]  Date:  04/06/2014   Name:  Craig Nelson   DOB:  07/18/1955   MRN:  [id number]  PCP:  No PCP Per Patient    Chief Complaint: Annual Exam and Prostate Check   History of Present Illness:  Craig Nelson is a 59 y.o. very pleasant male patient who presents with the following:  Here today for a CPE.  He is not fasting today- he had breakfast, just coffee and bread He has used meclizine as needed for occasional vertigo and would like to have some more.  Also he would like to try some daily cialis for ED.   No chest pain, no heart problems.  He has used viagra in the past but it seemed to be "too strong" for him  He is a never smoker.  He is a Education officer, environmental, married.   He has not yet had a colonoscopy but is willing to do this.   He is not sure about his last tetanus shot but believes it is due.   He enjoys jogging and working out at [employer] for exercise  He has put on about 30 lbs over the last 6 months; he has noted some weight gain and wants to do something about this.   He declines a flu shot but is ok with a tdap BP Readings from Last 3 Encounters:  04/06/14 142/86  09/14/13 139/87  05/03/13 130/82   Wt Readings from Last 3 Encounters:  04/06/14 258 lb 9.6 oz (117.3 kg)  09/14/13 224 lb 9.6 oz (101.878 kg)  05/03/13 225 lb 3.2 oz (102.15 kg)     There are no active problems to display for this patient.   Past Medical History  Diagnosis Date  . Allergy     History reviewed. No pertinent past surgical history.  History  Substance Use Topics  . Smoking status: Never Smoker   . Smokeless tobacco: Not on file  . Alcohol Use: No    Family History  Problem Relation Age of Onset  . Diabetes Mother   . Heart disease Mother   . Hyperlipidemia Mother     No Known Allergies  Medication list has been reviewed and updated.  Current Outpatient Prescriptions on File Prior to Visit  Medication Sig  Dispense Refill  . meclizine (ANTIVERT) 50 MG tablet Take 1 tablet (50 mg total) by mouth 3 (three) times daily as needed. (Patient not taking: Reported on 04/06/2014) 30 tablet 0   No current facility-administered medications on file prior to visit.    Review of Systems:  As per HPI- otherwise negative.   Physical Examination: Filed Vitals:   04/06/14 1139  BP: 142/86  Pulse: 91  Temp: 98 F (36.7 C)  Resp: 18   Filed Vitals:   04/06/14 1139  Height: 5' 11.5" (1.816 m)  Weight: 258 lb 9.6 oz (117.3 kg)   Body mass index is 35.57 kg/(m^2). Ideal Body Weight: Weight in (lb) to have BMI = 25: 181.4  GEN: WDWN, NAD, Non-toxic, A & O x 3, obese, looks well HEENT: Atraumatic, Normocephalic. Neck supple. No masses, No LAD.  Bilateral TM wnl, oropharynx normal.  PEERL,EOMI.   Ears and Nose: No external deformity. CV: RRR, No M/G/R. No JVD. No thrill. No extra heart sounds. PULM: CTA B, no wheezes, crackles, rhonchi. No retractions. No resp. distress. No accessory muscle use. ABD: S, NT, ND. No rebound. No HSM. EXTR: No  c/c/e NEURO Normal gait.  PSYCH: Normally interactive. Conversant. Not depressed or anxious appearing.  Calm demeanor.  GU: normal genitals and prostate  Assessment and Plan: Physical exam - Plan: CBC, Comprehensive metabolic panel, Lipid panel, Tdap vaccine greater than or equal to 7yo IM  Screening for prostate cancer - Plan: PSA  Weight gain - Plan: TSH  Other male erectile dysfunction - Plan: tadalafil (CIALIS) 5 MG tablet  Benign paroxysmal positional vertigo, unspecified laterality - Plan: meclizine (ANTIVERT) 50 MG tablet  Await labs, encouraged weight loss See patient instructions for more details.     Signed Abbe AmsterdamJessica Copland, MD

## 2014-04-06 NOTE — Patient Instructions (Addendum)
I will be in touch with your labs asap.   You have gained about 30 lbs- we will make sure that your thyroid is ok.  Assuming your thyroid is not the problem I would like to encourage you to work on your diet and exercise routine with a goal of getting back to about 225 lbs to start.    Try the cialis daily for ED . Start with 2.5 mg daily, and go to 5mg  if needed  Use the meclizine as needed for vertigo  You got your "tdap" today  Please schedule a screening colonoscopy with the GI doctor of your choice- you might want to see the same person who took care of your wife.

## 2014-04-07 ENCOUNTER — Encounter: Payer: Self-pay | Admitting: Family Medicine

## 2014-04-07 LAB — PSA: PSA: 1.7 ng/mL (ref <=4.00)

## 2014-11-09 ENCOUNTER — Ambulatory Visit (INDEPENDENT_AMBULATORY_CARE_PROVIDER_SITE_OTHER): Payer: 59 | Admitting: Family Medicine

## 2014-11-09 VITALS — BP 130/84 | HR 61 | Temp 97.7°F | Resp 16 | Ht 71.5 in | Wt 235.0 lb

## 2014-11-09 DIAGNOSIS — R361 Hematospermia: Secondary | ICD-10-CM | POA: Diagnosis not present

## 2014-11-09 DIAGNOSIS — R319 Hematuria, unspecified: Secondary | ICD-10-CM | POA: Diagnosis not present

## 2014-11-09 LAB — POCT URINALYSIS DIPSTICK
BILIRUBIN UA: NEGATIVE
Blood, UA: NEGATIVE
GLUCOSE UA: NEGATIVE
Ketones, UA: NEGATIVE
Leukocytes, UA: NEGATIVE
NITRITE UA: NEGATIVE
Protein, UA: NEGATIVE
Spec Grav, UA: 1.025
UROBILINOGEN UA: 0.2
pH, UA: 5

## 2014-11-09 LAB — POCT UA - MICROSCOPIC ONLY
Bacteria, U Microscopic: NEGATIVE
Casts, Ur, LPF, POC: NEGATIVE
Crystals, Ur, HPF, POC: NEGATIVE
Epithelial cells, urine per micros: NEGATIVE
MUCUS UA: NEGATIVE
RBC, URINE, MICROSCOPIC: NEGATIVE
Yeast, UA: NEGATIVE

## 2014-11-09 LAB — POCT CBC
GRANULOCYTE PERCENT: 61.8 % (ref 37–80)
HEMATOCRIT: 45.8 % (ref 43.5–53.7)
HEMOGLOBIN: 15.4 g/dL (ref 14.1–18.1)
Lymph, poc: 2 (ref 0.6–3.4)
MCH, POC: 29 pg (ref 27–31.2)
MCHC: 33.6 g/dL (ref 31.8–35.4)
MCV: 86.4 fL (ref 80–97)
MID (cbc): 0.4 (ref 0–0.9)
MPV: 7 fL (ref 0–99.8)
POC GRANULOCYTE: 4 (ref 2–6.9)
POC LYMPH PERCENT: 31.4 %L (ref 10–50)
POC MID %: 6.8 % (ref 0–12)
Platelet Count, POC: 253 10*3/uL (ref 142–424)
RBC: 5.3 M/uL (ref 4.69–6.13)
RDW, POC: 11.9 %
WBC: 6.5 10*3/uL (ref 4.6–10.2)

## 2014-11-09 MED ORDER — DOXYCYCLINE HYCLATE 100 MG PO CAPS
100.0000 mg | ORAL_CAPSULE | Freq: Two times a day (BID) | ORAL | Status: DC
Start: 2014-11-09 — End: 2016-07-16

## 2014-11-09 NOTE — Progress Notes (Signed)
  Subjective:  Patient ID: Craig Nelson, male    DOB: [birth year]  Age: 59 y.o. MRN: [id number]  58 year old man who is here having noted some brownish streaking in the toilet from his urine a couple of weeks ago. He did not note any more until the other day when his wife commented that after intercourse she noted that his semen had a brown discoloration to it. Apparently had a similar episode about 10 years ago and was treated with a round of antibiotics and everything cleared up and had no further problems. He has had a normal exam and PSA not too distant past about 7 months ago. He has not been having any rectal pain or problems. He has had a little discomfort in the lower pole of the left testicle at times. He also has a little left lower quadrant tenderness. No fevers.   Objective:   Normal male external genitalia, circumcised, testes descended. No hernias. Testes normal and nontender. Abdomen soft without masses but is mildly tender in a point specific area of the left lower quadrant over the sigmoid region. Digital rectal exam reveals prostate gland to be normal in size and contour. It does not seem to be excessively boggy are soft.  Assessment & Plan:   Results for orders placed or performed in visit on 11/09/14  POCT UA - Microscopic Only  Result Value Ref Range   WBC, Ur, HPF, POC 0-1    RBC, urine, microscopic neg    Bacteria, U Microscopic neg    Mucus, UA neg    Epithelial cells, urine per micros neg    Crystals, Ur, HPF, POC neg    Casts, Ur, LPF, POC neg    Yeast, UA neg   POCT urinalysis dipstick  Result Value Ref Range   Color, UA yellow    Clarity, UA clear    Glucose, UA neg    Bilirubin, UA neg    Ketones, UA neg    Spec Grav, UA 1.025    Blood, UA neg    pH, UA 5.0    Protein, UA neg    Urobilinogen, UA 0.2    Nitrite, UA neg    Leukocytes, UA Negative Negative    Assessment:  hematospermia versus blood in urine-could have a low grade epididymitis or  prostatitis, cannot determine on physical exam   Plan:   Doxycycline. Then he is to ejaculate a specimen and see if it looks like it has any blood in it. Return when necessary or refer to a urologist if he keeps having problems.  Patient Instructions  Take doxycycline 1 twice daily for 10 days  Drink plenty of fluids  After completion of the antibiotics course, collect a masturbated specimen and look at it to see if you see any brown or bloody color to the ejaculate. If you are persisting with any abnormality we will refer you to a urologist.  Return as needed     Arion Shankles, MD 11/09/2014

## 2014-11-09 NOTE — Patient Instructions (Signed)
Take doxycycline 1 twice daily for 10 days  Drink plenty of fluids  After completion of the antibiotics course, collect a masturbated specimen and look at it to see if you see any brown or bloody color to the ejaculate. If you are persisting with any abnormality we will refer you to a urologist.  Return as needed

## 2016-07-16 ENCOUNTER — Encounter: Payer: Self-pay | Admitting: Physician Assistant

## 2016-07-16 ENCOUNTER — Ambulatory Visit (INDEPENDENT_AMBULATORY_CARE_PROVIDER_SITE_OTHER): Payer: BLUE CROSS/BLUE SHIELD | Admitting: Physician Assistant

## 2016-07-16 ENCOUNTER — Ambulatory Visit (INDEPENDENT_AMBULATORY_CARE_PROVIDER_SITE_OTHER): Payer: BLUE CROSS/BLUE SHIELD

## 2016-07-16 VITALS — BP 183/61 | HR 73 | Temp 98.1°F | Resp 17 | Ht 72.5 in | Wt 231.0 lb

## 2016-07-16 DIAGNOSIS — M25561 Pain in right knee: Secondary | ICD-10-CM

## 2016-07-16 MED ORDER — MELOXICAM 15 MG PO TABS: 15.0000 mg | ORAL_TABLET | Freq: Every day | ORAL | 0 refills | Status: DC

## 2016-07-16 MED ORDER — DICLOFENAC SODIUM 75 MG PO TBEC: 75.0000 mg | DELAYED_RELEASE_TABLET | Freq: Two times a day (BID) | ORAL | 0 refills | Status: DC

## 2016-07-16 NOTE — Patient Instructions (Addendum)
Ice the knee three times per day for 15 minutes Please wear the knee drop when you are ambulating. Choose three stretches and do three times per day  Knee Exercises Ask your health care provider which exercises are safe for you. Do exercises exactly as told by your health care provider and adjust them as directed. It is normal to feel mild stretching, pulling, tightness, or discomfort as you do these exercises, but you should stop right away if you feel sudden pain or your pain gets worse.Do not begin these exercises until told by your health care provider. STRETCHING AND RANGE OF MOTION EXERCISES  These exercises warm up your muscles and joints and improve the movement and flexibility of your knee. These exercises also help to relieve pain, numbness, and tingling. Exercise A: Knee Extension, Prone  1. Lie on your abdomen on a bed. 2. Place your left / right knee just beyond the edge of the surface so your knee is not on the bed. You can put a towel under your left / right thigh just above your knee for comfort. 3. Relax your leg muscles and allow gravity to straighten your knee. You should feel a stretch behind your left / right knee. 4. Hold this position for __________ seconds. 5. Scoot up so your knee is supported between repetitions. Repeat __________ times. Complete this stretch __________ times a day. Exercise B: Knee Flexion, Active   1. Lie on your back with both knees straight. If this causes back discomfort, bend your left / right knee so your foot is flat on the floor. 2. Slowly slide your left / right heel back toward your buttocks until you feel a gentle stretch in the front of your knee or thigh. 3. Hold this position for __________ seconds. 4. Slowly slide your left / right heel back to the starting position. Repeat __________ times. Complete this exercise __________ times a day. Exercise C: Quadriceps, Prone   1. Lie on your abdomen on a firm surface, such as a bed or  padded floor. 2. Bend your left / right knee and hold your ankle. If you cannot reach your ankle or pant leg, loop a belt around your foot and grab the belt instead. 3. Gently pull your heel toward your buttocks. Your knee should not slide out to the side. You should feel a stretch in the front of your thigh and knee. 4. Hold this position for __________ seconds. Repeat __________ times. Complete this stretch __________ times a day. Exercise D: Hamstring, Supine  1. Lie on your back. 2. Loop a belt or towel over the ball of your left / right foot. The ball of your foot is on the walking surface, right under your toes. 3. Straighten your left / right knee and slowly pull on the belt to raise your leg until you feel a gentle stretch behind your knee.  Do not let your left / right knee bend while you do this.  Keep your other leg flat on the floor. 4. Hold this position for __________ seconds. Repeat __________ times. Complete this stretch __________ times a day. STRENGTHENING EXERCISES  These exercises build strength and endurance in your knee. Endurance is the ability to use your muscles for a long time, even after they get tired. Exercise E: Quadriceps, Isometric   1. Lie on your back with your left / right leg extended and your other knee bent. Put a rolled towel or small pillow under your knee if told by your  health care provider. 2. Slowly tense the muscles in the front of your left / right thigh. You should see your kneecap slide up toward your hip or see increased dimpling just above the knee. This motion will push the back of the knee toward the floor. 3. For __________ seconds, keep the muscle as tight as you can without increasing your pain. 4. Relax the muscles slowly and completely. Repeat __________ times. Complete this exercise __________ times a day. Exercise F: Straight Leg Raises - Quadriceps  1. Lie on your back with your left / right leg extended and your other knee  bent. 2. Tense the muscles in the front of your left / right thigh. You should see your kneecap slide up or see increased dimpling just above the knee. Your thigh may even shake a bit. 3. Keep these muscles tight as you raise your leg 4-6 inches (10-15 cm) off the floor. Do not let your knee bend. 4. Hold this position for __________ seconds. 5. Keep these muscles tense as you lower your leg. 6. Relax your muscles slowly and completely after each repetition. Repeat __________ times. Complete this exercise __________ times a day. Exercise G: Hamstring, Isometric  1. Lie on your back on a firm surface. 2. Bend your left / right knee approximately __________ degrees. 3. Dig your left / right heel into the surface as if you are trying to pull it toward your buttocks. Tighten the muscles in the back of your thighs to dig as hard as you can without increasing any pain. 4. Hold this position for __________ seconds. 5. Release the tension gradually and allow your muscles to relax completely for __________ seconds after each repetition. Repeat __________ times. Complete this exercise __________ times a day. Exercise H: Hamstring Curls   If told by your health care provider, do this exercise while wearing ankle weights. Begin with __________ weights. Then increase the weight by 1 lb (0.5 kg) increments. Do not wear ankle weights that are more than __________. 1. Lie on your abdomen with your legs straight. 2. Bend your left / right knee as far as you can without feeling pain. Keep your hips flat against the floor. 3. Hold this position for __________ seconds. 4. Slowly lower your leg to the starting position. Repeat __________ times. Complete this exercise __________ times a day. Exercise I: Squats (Quadriceps)  1. Stand in front of a table, with your feet and knees pointing straight ahead. You may rest your hands on the table for balance but not for support. 2. Slowly bend your knees and lower your  hips like you are going to sit in a chair.  Keep your weight over your heels, not over your toes.  Keep your lower legs upright so they are parallel with the table legs.  Do not let your hips go lower than your knees.  Do not bend lower than told by your health care provider.  If your knee pain increases, do not bend as low. 3. Hold the squat position for __________ seconds. 4. Slowly push with your legs to return to standing. Do not use your hands to pull yourself to standing. Repeat __________ times. Complete this exercise __________ times a day. Exercise J: Wall Slides (Quadriceps)   1. Lean your back against a smooth wall or door while you walk your feet out 18-24 inches (46-61 cm) from it. 2. Place your feet hip-width apart. 3. Slowly slide down the wall or door until your knees Repeat __________  times. Complete this exercise __________ times a day. 4. Exercise K: Straight Leg Raises - Hip Abductors  1. Lie on your side with your left / right leg in the top position. Lie so your head, shoulder, knee, and hip line up. You may bend your bottom knee to help you keep your balance. 2. Roll your hips slightly forward so your hips are stacked directly over each other and your left / right knee is facing forward. 3. Leading with your heel, lift your top leg 4-6 inches (10-15 cm). You should feel the muscles in your outer hip lifting.  Do not let your foot drift forward.  Do not let your knee roll toward the ceiling. 4. Hold this position for __________ seconds. 5. Slowly return your leg to the starting position. 6. Let your muscles relax completely after each repetition. Repeat __________ times. Complete this exercise __________ times a day. Exercise L: Straight Leg Raises - Hip Extensors  1. Lie on your abdomen on a firm surface. You can put a pillow under your hips if that is more comfortable. 2. Tense the muscles in your buttocks and lift your left / right leg about 4-6 inches (10-15  cm). Keep your knee straight as you lift your leg. 3. Hold this position for __________ seconds. 4. Slowly lower your leg to the starting position. 5. Let your leg relax completely after each repetition. Repeat __________ times. Complete this exercise __________ times a day. This information is not intended to replace advice given to you by your health care provider. Make sure you discuss any questions you have with your health care provider. Document Released: 01/09/2005 Document Revised: 11/20/2015 Document Reviewed: 01/01/2015 Elsevier Interactive Patient Education  2017 [employer].    IF you received an x-ray today, you will receive an invoice from [facility]. Please contact [facility] at [phone] with questions or concerns regarding your invoice.   IF you received labwork today, you will receive an invoice from [facility]. Please contact [facility] at [phone] with questions or concerns regarding your invoice.   Our billing staff will not be able to assist you with questions regarding bills from these companies.  You will be contacted with the lab results as soon as they are available. The fastest way to get your results is to activate your My Chart account. Instructions are located on the last page of this paperwork. If you have not heard from [place] regarding the results in 2 weeks, please contact this office.

## 2016-07-16 NOTE — Progress Notes (Signed)
PRIMARY CARE AT [facility] [address] [place] [address] [phone]  Date:  07/16/2016   Name:  Craig Nelson   DOB:  May 07, 1955   MRN:  [id number]  PCP:  Patient, No Pcp Per    History of Present Illness:  Craig Nelson is a 61 y.o. male patient who presents to PCP with  Chief Complaint  Patient presents with  . Knee Pain     1-1.5 years ago, started to have rightl lower leg pain that has progressively worsened.  The back of the leg is painful, if he goes down on the floor, he can not stand up.  Mildly swollen.   He recalls a hx of injuring the knee in baseball.  He has noticed some instability to the knee.  Pain radiates down the back of the lower leg.    Patient Active Problem List   Diagnosis Date Noted  . Obesity 04/06/2014  . Erectile dysfunction 04/06/2014    Past Medical History:  Diagnosis Date  . Allergy     No past surgical history on file.  Social History  Substance Use Topics  . Smoking status: Never Smoker  . Smokeless tobacco: Never Used  . Alcohol use No    Family History  Problem Relation Age of Onset  . Diabetes Mother   . Heart disease Mother   . Hyperlipidemia Mother     No Known Allergies  Medication list has been reviewed and updated.  Current Outpatient Prescriptions on File Prior to Visit  Medication Sig Dispense Refill  . meclizine (ANTIVERT) 50 MG tablet Take 1 tablet (50 mg total) by mouth 2 (two) times daily as needed. (Patient not taking: Reported on 11/09/2014) 30 tablet 0  . tadalafil (CIALIS) 5 MG tablet Take daily for erectile dysfunction.  Start with a 1/2 tab, can use a whole tab if needed (Patient not taking: Reported on 11/09/2014) 30 tablet 6   No current facility-administered medications on file prior to visit.     ROS ROS otherwise unremarkable unless listed above.  Physical Examination: BP (!) 183/61   Pulse 73   Temp 98.1 F (36.7 C) (Oral)   Resp 17   Ht 6' 0.5" (1.842 m)   Wt 231 lb (104.8 kg)   SpO2 97%    BMI 30.90 kg/m  Ideal Body Weight: Weight in (lb) to have BMI = 25: 186.5  Physical Exam  Constitutional: He is oriented to person, place, and time. He appears well-developed and well-nourished. No distress.  HENT:  Head: Normocephalic and atraumatic.  Eyes: Conjunctivae and EOM are normal. Pupils are equal, round, and reactive to light.  Cardiovascular: Normal rate.   Pulmonary/Chest: Effort normal. No respiratory distress.  Musculoskeletal:       Right knee: He exhibits deformity (Bony abnormality along the knee consistent with arthritic changes.  ) and abnormal alignment (valgus abnormality accentuated with walking). He exhibits no swelling (larger in size compartively then left, however no effusion noted), no LCL laxity, no bony tenderness, normal meniscus and no MCL laxity. No tenderness found. No medial joint line and no lateral joint line tenderness noted.  Neurological: He is alert and oriented to person, place, and time.  Skin: Skin is warm and dry. He is not diaphoretic.  Psychiatric: He has a normal mood and affect. His behavior is normal.   Dg Knee Complete 4 Views Right  Result Date: 07/16/2016 CLINICAL DATA:  RIGHT knee pain for a long time, no known injury EXAM: RIGHT KNEE -  COMPLETE 4+ VIEW COMPARISON:  None FINDINGS: Osseous mineralization normal. Minimal joint space narrowing. No acute fracture, dislocation, or bone destruction. No knee joint effusion. Metallic foreign body anteromedial aspect distal RIGHT thigh, 5 mm length. IMPRESSION: Minimal degenerative changes without acute bony abnormalities. Metallic foreign body at anteromedial distal RIGHT thigh. Electronically Signed   By: Ulyses SouthwardMark  Boles M.D.   On: 07/16/2016 09:20      Assessment and Plan: Craig Nelson is a 61 y.o. male who is here today for cc of right knee pain.   Arthritic changes.  Given diclofenac 75 bid.  Take with food.   Icing.  Cancelled the meloxicam at this time.   He can use aspercreme over the  counter. Acute pain of right knee - Plan: DG Knee Complete 4 Views Right, diclofenac (VOLTAREN) 75 MG EC tablet, meloxicam (MOBIC) 15 MG tablet, diclofenac sodium (VOLTAREN) 1 % GEL  Trena PlattStephanie English, PA-C [facility] 5/9/20188:31 PM

## 2016-07-17 MED ORDER — DICLOFENAC SODIUM 1 % TD GEL: 4.0000 g | Freq: Four times a day (QID) | TRANSDERMAL | 0 refills | Status: DC

## 2016-07-24 ENCOUNTER — Telehealth: Payer: Self-pay

## 2016-08-18 ENCOUNTER — Other Ambulatory Visit: Payer: Self-pay | Admitting: Physician Assistant

## 2016-08-18 DIAGNOSIS — M25561 Pain in right knee: Secondary | ICD-10-CM

## 2016-09-07 ENCOUNTER — Ambulatory Visit (INDEPENDENT_AMBULATORY_CARE_PROVIDER_SITE_OTHER): Payer: BLUE CROSS/BLUE SHIELD | Admitting: Physician Assistant

## 2016-09-07 ENCOUNTER — Encounter: Payer: Self-pay | Admitting: Physician Assistant

## 2016-09-07 VITALS — BP 150/72 | HR 77 | Temp 97.8°F | Resp 17 | Ht 72.5 in | Wt 235.0 lb

## 2016-09-07 DIAGNOSIS — G8929 Other chronic pain: Secondary | ICD-10-CM

## 2016-09-07 DIAGNOSIS — M25561 Pain in right knee: Secondary | ICD-10-CM | POA: Diagnosis not present

## 2016-09-07 MED ORDER — DICLOFENAC SODIUM 75 MG PO TBEC: 75.0000 mg | DELAYED_RELEASE_TABLET | Freq: Two times a day (BID) | ORAL | 0 refills | Status: DC

## 2016-09-07 MED ORDER — ACETAMINOPHEN 500 MG PO TABS: 500.0000 mg | ORAL_TABLET | Freq: Four times a day (QID) | ORAL | 0 refills | Status: AC | PRN

## 2016-09-07 NOTE — Patient Instructions (Addendum)
For knee pain, I have given you a prescription for extra strength tylenol. Use this every 6 hours for pain. You can use diclofenac for breakthrough pain every 12 hours. I will contact you with you kidney function results to ensure your kidneys are okay to handle this medication. You should hear from ortho in the next 2 weeks, if you do not, contact our office. Thank you for letting me participate in your health and well being.   Arthritis Arthritis means joint pain. It can also mean joint disease. A joint is a place where bones come together. People who have arthritis may have:  Red joints.  Swollen joints.  Stiff joints.  Warm joints.  A fever.  A feeling of being sick.  Follow these instructions at home: Pay attention to any changes in your symptoms. Take these actions to help with your pain and swelling. Medicines  Take over-the-counter and prescription medicines only as told by your doctor.  Do not take aspirin for pain if your doctor says that you may have gout. Activity  Rest your joint if your doctor tells you to.  Avoid activities that make the pain worse.  Exercise your joint regularly as told by your doctor. Try doing exercises like: ? Swimming. ? Water aerobics. ? Biking. ? Walking. Joint Care   If your joint is swollen, keep it raised (elevated) if told by your doctor.  If your joint feels stiff in the morning, try taking a warm shower.  If you have diabetes, do not apply heat without asking your doctor.  If told, apply heat to the joint: ? Put a towel between the joint and the hot pack or heating pad. ? Leave the heat on the area for 20-30 minutes.  If told, apply ice to the joint: ? Put ice in a plastic bag. ? Place a towel between your skin and the bag. ? Leave the ice on for 20 minutes, 2-3 times per day.  Keep all follow-up visits as told by your doctor. Contact a doctor if:  The pain gets worse.  You have a fever. Get help right away  if:  You have very bad pain in your joint.  You have swelling in your joint.  Your joint is red.  Many joints become painful and swollen.  You have very bad back pain.  Your leg is very weak.  You cannot control your pee (urine) or poop (stool). This information is not intended to replace advice given to you by your health care provider. Make sure you discuss any questions you have with your health care provider. Document Released: 05/22/2009 Document Revised: 08/03/2015 Document Reviewed: 05/23/2014 Elsevier Interactive Patient Education  2018 [employer]     IF you received an x-ray today, you will receive an invoice from [facility]. Please contact [facility] at [phone] with questions or concerns regarding your invoice.   IF you received labwork today, you will receive an invoice from [facility]. Please contact [facility] at [phone] with questions or concerns regarding your invoice.   Our billing staff will not be able to assist you with questions regarding bills from these companies.  You will be contacted with the lab results as soon as they are available. The fastest way to get your results is to activate your My Chart account. Instructions are located on the last page of this paperwork. If you have not heard from us regarding the results in 2 weeks, please contact this office.

## 2016-09-07 NOTE — Progress Notes (Signed)
Craig Nelson  MRN: [id number] DOB: [birth year]  Subjective:   Craig Nelson is a 61 y.o. male who presents for follow up on a knee problem involving the right knee. Onset was 10 years. Inciting event: injured while he was young. Current symptoms include: crepitus sensation, giving out and pain located around patella. Pain is aggravated by standing. Patient has had prior knee problems. Evaluation to date: Saw PA English on 07/16/16 for same issue. Plain films showed minimal degenerative changes without acute bony abnormalities.Treatment to date: prescription NSAIDS which are effective. But he only had 2 weeks worth of diclofenac tablets then ran out. Has tried the diclofenac gel and aspercreme which do not help. He wears a knee brace, which does help with stability. 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  Review of Systems  Per HPI  Patient Active Problem List   Diagnosis Date Noted  . Obesity 04/06/2014  . Erectile dysfunction 04/06/2014    Current Outpatient Prescriptions on File Prior to Visit  Medication Sig Dispense Refill  . diclofenac sodium (VOLTAREN) 1 % GEL Apply 4 g topically 4 (four) times daily. 100 g 0  . diclofenac (VOLTAREN) 75 MG EC tablet Take 1 tablet (75 mg total) by mouth 2 (two) times daily. (Patient not taking: Reported on 09/07/2016) 30 tablet 0  . meclizine (ANTIVERT) 50 MG tablet Take 1 tablet (50 mg total) by mouth 2 (two) times daily as needed. (Patient not taking: Reported on 11/09/2014) 30 tablet 0  . tadalafil (CIALIS) 5 MG tablet Take daily for erectile dysfunction.  Start with a 1/2 tab, can use a whole tab if needed (Patient not taking: Reported on 11/09/2014) 30 tablet 6   No current facility-administered medications on file prior to visit.     No Known Allergies   Objective:  BP (!) 150/72   Pulse 77   Temp 97.8 F (36.6 C) (Oral)   Resp 17   Ht 6' 0.5" (1.842 m)   Wt 235 lb (106.6 kg)   SpO2 97%   BMI 31.43 kg/m   Physical Exam    Constitutional: He is oriented to person, place, and time and well-developed, well-nourished, and in no distress.  HENT:  Head: Normocephalic and atraumatic.  Eyes: Conjunctivae are normal.  Neck: Normal range of motion.  Pulmonary/Chest: Effort normal.  Musculoskeletal:       Right knee: He exhibits swelling (larger in size compared to left knee but no effusion noted) and deformity (moderate crepitus noted with knee flexion and extension ). He exhibits normal range of motion, normal alignment, no LCL laxity, normal patellar mobility, no bony tenderness, normal meniscus and no MCL laxity. No medial joint line, no lateral joint line, no MCL, no LCL and no patellar tendon tenderness noted.       Left knee: Normal.  Hyperpigmentation of knees bilaterally   Neurological: He is alert and oriented to person, place, and time. Gait normal.  Skin: Skin is warm and dry.  Psychiatric: Affect normal.  Vitals reviewed.   Assessment and Plan :   1. Chronic pain of right knee Encouraged pt to use tylenol extra strength for knee pain while he awaiting evaluation by orthopedics and to use diclofenac for breakthrough pain. CMP pending.  - Ambulatory referral to Orthopedic Surgery - CMP14+EGFR - acetaminophen (TYLENOL) 500 MG tablet; Take 1 tablet (500 mg total) by mouth every 6 (six) hours as needed.  Dispense: 30 tablet; Refill: 0 - diclofenac (VOLTAREN) 75 MG  EC tablet; Take 1 tablet (75 mg total) by mouth 2 (two) times daily.  Dispense: 30 tablet; Refill: 0  Tenna Delaine, PA-C  Primary Care at [facility] 09/07/2016 5:49 PM

## 2016-09-08 LAB — CMP14+EGFR
ALBUMIN: 4.4 g/dL (ref 3.6–4.8)
ALK PHOS: 94 IU/L (ref 39–117)
ALT: 25 IU/L (ref 0–44)
AST: 25 IU/L (ref 0–40)
Albumin/Globulin Ratio: 1.6 (ref 1.2–2.2)
BILIRUBIN TOTAL: 0.3 mg/dL (ref 0.0–1.2)
BUN / CREAT RATIO: 26 — AB (ref 10–24)
BUN: 25 mg/dL (ref 8–27)
CHLORIDE: 104 mmol/L (ref 96–106)
CO2: 21 mmol/L (ref 20–29)
Calcium: 9.1 mg/dL (ref 8.6–10.2)
Creatinine, Ser: 0.98 mg/dL (ref 0.76–1.27)
GFR calc Af Amer: 95 mL/min/{1.73_m2} (ref >59)
GFR calc non Af Amer: 82 mL/min/{1.73_m2} (ref >59)
GLOBULIN, TOTAL: 2.7 g/dL (ref 1.5–4.5)
GLUCOSE: 144 mg/dL — AB (ref 65–99)
POTASSIUM: 4.3 mmol/L (ref 3.5–5.2)
SODIUM: 141 mmol/L (ref 134–144)
Total Protein: 7.1 g/dL (ref 6.0–8.5)

## 2016-12-31 NOTE — Telephone Encounter (Signed)
Error

## 2017-06-09 ENCOUNTER — Encounter: Payer: Self-pay | Admitting: Physician Assistant

## 2017-12-29 ENCOUNTER — Ambulatory Visit: Payer: BLUE CROSS/BLUE SHIELD | Admitting: Family Medicine

## 2017-12-29 ENCOUNTER — Other Ambulatory Visit: Payer: Self-pay

## 2017-12-29 ENCOUNTER — Encounter: Payer: Self-pay | Admitting: Family Medicine

## 2017-12-29 VITALS — BP 122/70 | HR 65 | Temp 97.8°F | Ht 72.0 in | Wt 233.0 lb

## 2017-12-29 DIAGNOSIS — H81399 Other peripheral vertigo, unspecified ear: Secondary | ICD-10-CM

## 2017-12-29 DIAGNOSIS — R739 Hyperglycemia, unspecified: Secondary | ICD-10-CM

## 2017-12-29 LAB — GLUCOSE, POCT (MANUAL RESULT ENTRY): POC GLUCOSE: 94 mg/dL (ref 70–99)

## 2017-12-29 MED ORDER — MECLIZINE HCL 25 MG PO TABS: 25.0000 mg | ORAL_TABLET | Freq: Three times a day (TID) | ORAL | 0 refills | Status: DC | PRN

## 2017-12-29 MED ORDER — DIAZEPAM 2 MG PO TABS: 2.0000 mg | ORAL_TABLET | Freq: Two times a day (BID) | ORAL | 0 refills | Status: DC | PRN

## 2017-12-29 NOTE — Progress Notes (Signed)
Subjective:  By signing my name below, I, [name], attest that this documentation has been prepared under the direction and in the presence of Meredith Staggers, MD. Electronically Signed: [name], Scribe. 12/29/2017 , 3:32 PM .  Patient was seen in Room 10 .   Patient ID: Craig Nelson, male    DOB: May 08, 1955, 62 y.o.   MRN: [id number] Chief Complaint  Patient presents with  . Dizziness    4-5 days   . Emesis    yesterday    HPI Craig Nelson is a 62 y.o. male  Here for dizziness and vomiting yesterday. Per chart review, he was diagnosed with BPPV/vertigo in 2015, treated with antivert at that time.   Patient states his dizziness started about 3-4 days ago while he was at work. He noticed things spinning to the left, and closed his eyes, things would turn to the left. His dizziness worsened yesterday, where if he moves his head too fast, he felt more sick and vomited. He notes this is more or less similar to his vertigo in 2015. He would keep the meclizine on him but doesn't use it daily, only as needed. In the past few days, he's taken 3 tablets of meclizine but without relief. In the past, he mentions having some inner ear problems. He denies any cold virus or illnesses recently. He denies any ear pain, hearing loss, tinnitus, chest pain or syncope. His last dose of meclizine was yesterday. He had a light headache over bilateral temples.   He had glucose of 144 in Aug 2018; last meal eaten at 11:00AM.   He's from [place] he was 62 years old.    Patient Active Problem List   Diagnosis Date Noted  . Obesity 04/06/2014  . Erectile dysfunction 04/06/2014   Past Medical History:  Diagnosis Date  . Allergy    No past surgical history on file. No Known Allergies Prior to Admission medications   Medication Sig Start Date End Date Taking? Authorizing Provider  acetaminophen (TYLENOL) 500 MG tablet Take 1 tablet (500 mg total) by mouth every 6  (six) hours as needed. 09/07/16  Yes Benjiman Core D, PA-C  diclofenac (VOLTAREN) 75 MG EC tablet Take 1 tablet (75 mg total) by mouth 2 (two) times daily. 09/07/16  Yes Barnett Abu, [place] D, PA-C  diclofenac sodium (VOLTAREN) 1 % GEL Apply 4 g topically 4 (four) times daily. 07/17/16  Yes English, Judeth Cornfield D, PA  meclizine (ANTIVERT) 50 MG tablet Take 1 tablet (50 mg total) by mouth 2 (two) times daily as needed. 04/06/14  Yes Copland, Gwenlyn Found, MD  tadalafil (CIALIS) 5 MG tablet Take daily for erectile dysfunction.  Start with a 1/2 tab, can use a whole tab if needed 04/06/14  Yes Copland, Gwenlyn Found, MD   Social History   Socioeconomic History  . Marital status: Married    Spouse name: Not on file  . Number of children: Not on file  . Years of education: Not on file  . Highest education level: Not on file  Occupational History  . Not on file  Social Needs  . Financial resource strain: Not on file  . Food insecurity:    Worry: Not on file    Inability: Not on file  . Transportation needs:    Medical: Not on file    Non-medical: Not on file  Tobacco Use  . Smoking status: Never Smoker  . Smokeless tobacco: Never Used  Substance  and Sexual Activity  . Alcohol use: No  . Drug use: No  . Sexual activity: Yes  Lifestyle  . Physical activity:    Days per week: Not on file    Minutes per session: Not on file  . Stress: Not on file  Relationships  . Social connections:    Talks on phone: Not on file    Gets together: Not on file    Attends religious service: Not on file    Active member of club or organization: Not on file    Attends meetings of clubs or organizations: Not on file    Relationship status: Not on file  . Intimate partner violence:    Fear of current or ex partner: Not on file    Emotionally abused: Not on file    Physically abused: Not on file    Forced sexual activity: Not on file  Other Topics Concern  . Not on file  Social History Narrative  . Not on file    Review of Systems  Constitutional: Negative for fatigue and unexpected weight change.  HENT: Negative for ear pain and hearing loss.   Eyes: Negative for visual disturbance.  Respiratory: Negative for cough, chest tightness and shortness of breath.   Cardiovascular: Negative for chest pain, palpitations and leg swelling.  Gastrointestinal: Positive for vomiting. Negative for abdominal pain and blood in stool.  Neurological: Positive for dizziness and headaches. Negative for syncope and light-headedness.       Objective:   Physical Exam  Constitutional: He is oriented to person, place, and time. He appears well-developed and well-nourished. No distress.  HENT:  Head: Normocephalic and atraumatic.  Eyes: Pupils are equal, round, and reactive to light. Right eye exhibits nystagmus.  2 fast beats of nystagmus to the right; when laying supine, increased nystagmus to the right, fast beats to the right  Neck: Neck supple. Carotid bruit is not present.  Cardiovascular: Normal rate, regular rhythm and normal heart sounds.  No murmur heard. Pulmonary/Chest: Effort normal and breath sounds normal. No respiratory distress.  Musculoskeletal: Normal range of motion.  Neurological: He is alert and oriented to person, place, and time. He has normal strength. He displays a negative Romberg sign.  No pronator drift, non focal exam, no facial droop, equal grip strength  Skin: Skin is warm and dry.  Psychiatric: He has a normal mood and affect. His behavior is normal.  Nursing note and vitals reviewed.   Vitals:   12/29/17 1430 12/29/17 1432  BP: (!) 146/93 122/70  Pulse: 65   Temp: 97.8 F (36.6 C)   TempSrc: Oral   SpO2: 97%   Weight: 233 lb (105.7 kg)   Height: 6' (1.829 m)    Orthostatic VS for the past 24 hrs (Last 3 readings):  BP- Lying Pulse- Lying BP- Standing at 0 minutes Pulse- Standing at 0 minutes BP- Standing at 3 minutes Pulse- Standing at 3 minutes  12/29/17 1444 135/80 93  (!) 137/92 74 (!) 151/97 77   Results for orders placed or performed in visit on 12/29/17  POCT glucose (manual entry)  Result Value Ref Range   POC Glucose 94 70 - 99 mg/dl        Assessment & Plan:   Craig Nelson is a 62 y.o. male Peripheral vertigo, unspecified laterality - Plan: meclizine (ANTIVERT) 25 MG tablet, diazepam (VALIUM) 2 MG tablet  Hyperglycemia - Plan: POCT glucose (manual entry)  Symptoms and exam consistent with peripheral vertigo, nonfocal neurologic  exam, no concerning findings on history/exam.  Similar vertigo in the past.  -Meclizine 25 mg every 8 hours, handout given on vertigo as well as self Epley maneuver.  Option of diazepam 2 to 4 mg if needed, potential side effects and risk discussed.  -ER/RTC precautions discussed  Meds ordered this encounter  Medications  . meclizine (ANTIVERT) 25 MG tablet    Sig: Take 1 tablet (25 mg total) by mouth 3 (three) times daily as needed for dizziness.    Dispense:  30 tablet    Refill:  0  . diazepam (VALIUM) 2 MG tablet    Sig: Take 1-2 tablets (2-4 mg total) by mouth every 12 (twelve) hours as needed (vertigo).    Dispense:  6 tablet    Refill:  0   Patient Instructions   Symptoms today appear to be due to peripheral vertigo.  See information below on vertigo as well as on maneuver to help with vertigo.  Meclizine every 8 hours as needed, Valium if needed for symptoms that are not relieved with meclizine, but be cautious with that medicine as it can also cause dizziness/sedation.   Return to the clinic or go to the nearest emergency room if any of your symptoms worsen or new symptoms occur.  How to Perform the Epley Maneuver The Epley maneuver is an exercise that relieves symptoms of vertigo. Vertigo is the feeling that you or your surroundings are moving when they are not. When you feel vertigo, you may feel like the room is spinning and have trouble walking. Dizziness is a little different than vertigo. When you  are dizzy, you may feel unsteady or light-headed. You can do this maneuver at home whenever you have symptoms of vertigo. You can do it up to 3 times a day until your symptoms go away. Even though the Epley maneuver may relieve your vertigo for a few weeks, it is possible that your symptoms will return. This maneuver relieves vertigo, but it does not relieve dizziness. What are the risks? If it is done correctly, the Epley maneuver is considered safe. Sometimes it can lead to dizziness or nausea that goes away after a short time. If you develop other symptoms, such as changes in vision, weakness, or numbness, stop doing the maneuver and call your health care provider. How to perform the Epley maneuver 1. Sit on the edge of a bed or table with your back straight and your legs extended or hanging over the edge of the bed or table. 2. Turn your head halfway toward the affected ear or side. 3. Lie backward quickly with your head turned until you are lying flat on your back. You may want to position a pillow under your shoulders. 4. Hold this position for 30 seconds. You may experience an attack of vertigo. This is normal. 5. Turn your head to the opposite direction until your unaffected ear is facing the floor. 6. Hold this position for 30 seconds. You may experience an attack of vertigo. This is normal. Hold this position until the vertigo stops. 7. Turn your whole body to the same side as your head. Hold for another 30 seconds. 8. Sit back up. You can repeat this exercise up to 3 times a day. Follow these instructions at home:  After doing the Epley maneuver, you can return to your normal activities.  Ask your health care provider if there is anything you should do at home to prevent vertigo. He or she may recommend that you: ?  Keep your head raised (elevated) with two or more pillows while you sleep. ? Do not sleep on the side of your affected ear. ? Get up slowly from bed. ? Avoid sudden  movements during the day. ? Avoid extreme head movement, like looking up or bending over. Contact a health care provider if:  Your vertigo gets worse.  You have other symptoms, including: ? Nausea. ? Vomiting. ? Headache. Get help right away if:  You have vision changes.  You have a severe or worsening headache or neck pain.  You cannot stop vomiting.  You have new numbness or weakness in any part of your body. Summary  Vertigo is the feeling that you or your surroundings are moving when they are not.  The Epley maneuver is an exercise that relieves symptoms of vertigo.  If the Epley maneuver is done correctly, it is considered safe. You can do it up to 3 times a day. This information is not intended to replace advice given to you by your health care provider. Make sure you discuss any questions you have with your health care provider. Document Released: 03/02/2013 Document Revised: 01/16/2016 Document Reviewed: 01/16/2016 Elsevier Interactive Patient Education  2017 [employer]   Vertigo Vertigo is the feeling that you or your surroundings are moving when they are not. Vertigo can be dangerous if it occurs while you are doing something that could endanger you or others, such as driving. What are the causes? This condition is caused by a disturbance in the signals that are sent by your body's sensory systems to your brain. Different causes of a disturbance can lead to vertigo, including:  Infections, especially in the inner ear.  A bad reaction to a drug, or misuse of alcohol and medicines.  Withdrawal from drugs or alcohol.  Quickly changing positions, as when lying down or rolling over in bed.  Migraine headaches.  Decreased blood flow to the brain.  Decreased blood pressure.  Increased pressure in the brain from a head or neck injury, stroke, infection, tumor, or bleeding.  Central nervous system disorders.  What are the signs or symptoms? Symptoms of this  condition usually occur when you move your head or your eyes in different directions. Symptoms may start suddenly, and they usually last for less than a minute. Symptoms may include:  Loss of balance and falling.  Feeling like you are spinning or moving.  Feeling like your surroundings are spinning or moving.  Nausea and vomiting.  Blurred vision or double vision.  Difficulty hearing.  Slurred speech.  Dizziness.  Involuntary eye movement (nystagmus).  Symptoms can be mild and cause only slight annoyance, or they can be severe and interfere with daily life. Episodes of vertigo may return (recur) over time, and they are often triggered by certain movements. Symptoms may improve over time. How is this diagnosed? This condition may be diagnosed based on medical history and the quality of your nystagmus. Your health care provider may test your eye movements by asking you to quickly change positions to trigger the nystagmus. This may be called the Dix-Hallpike test, head thrust test, or roll test. You may be referred to a health care provider who specializes in ear, nose, and throat (ENT) problems (otolaryngologist) or a provider who specializes in disorders of the central nervous system (neurologist). You may have additional testing, including:  A physical exam.  Blood tests.  MRI.  A CT scan.  An electrocardiogram (ECG). This records electrical activity in your heart.  An electroencephalogram (EEG). This records electrical activity in your brain.  Hearing tests.  How is this treated? Treatment for this condition depends on the cause and the severity of the symptoms. Treatment options include:  Medicines to treat nausea or vertigo. These are usually used for severe cases. Some medicines that are used to treat other conditions may also reduce or eliminate vertigo symptoms. These include: ? Medicines that control allergies (antihistamines). ? Medicines that control seizures  (anticonvulsants). ? Medicines that relieve depression (antidepressants). ? Medicines that relieve anxiety (sedatives).  Head movements to adjust your inner ear back to normal. If your vertigo is caused by an ear problem, your health care provider may recommend certain movements to correct the problem.  Surgery. This is rare.  Follow these instructions at home: Safety  Move slowly.Avoid sudden body or head movements.  Avoid driving.  Avoid operating heavy machinery.  Avoid doing any tasks that would cause danger to you or others if you would have a vertigo episode during the task.  If you have trouble walking or keeping your balance, try using a cane for stability. If you feel dizzy or unstable, sit down right away.  Return to your normal activities as told by your health care provider. Ask your health care provider what activities are safe for you. General instructions  Take over-the-counter and prescription medicines only as told by your health care provider.  Avoid certain positions or movements as told by your health care provider.  Drink enough fluid to keep your urine clear or pale yellow.  Keep all follow-up visits as told by your health care provider. This is important. Contact a health care provider if:  Your medicines do not relieve your vertigo or they make it worse.  You have a fever.  Your condition gets worse or you develop new symptoms.  Your family or friends notice any behavioral changes.  Your nausea or vomiting gets worse.  You have numbness or a "pins and needles" sensation in part of your body. Get help right away if:  You have difficulty moving or speaking.  You are always dizzy.  You faint.  You develop severe headaches.  You have weakness in your hands, arms, or legs.  You have changes in your hearing or vision.  You develop a stiff neck.  You develop sensitivity to light. This information is not intended to replace advice given to  you by your health care provider. Make sure you discuss any questions you have with your health care provider. Document Released: 12/05/2004 Document Revised: 08/09/2015 Document Reviewed: 06/20/2014 Elsevier Interactive Patient Education  [employer].   If you have lab work done today you will be contacted with your lab results within the next 2 weeks.  If you have not heard from [place] then please contact us. The fastest way to get your results is to register for My Chart.   IF you received an x-ray today, you will receive an invoice from [facility]. Please contact [facility] at [phone] with questions or concerns regarding your invoice.   IF you received labwork today, you will receive an invoice from [facility] [place] [facility]. Please contact [facility] at [phone] with questions or concerns regarding your invoice.   Our billing staff will not be able to assist you with questions regarding bills from these companies.  You will be contacted with the lab results as soon as they are available. The fastest way to get your results is to activate your My Chart account. Instructions  are located on the last page of this paperwork. If you have not heard from [place] regarding the results in 2 weeks, please contact this office.        I personally performed the services described in this documentation, which was scribed in my presence. The recorded information has been reviewed and considered for accuracy and completeness, addended by me as needed, and agree with information above.  Signed,   Meredith Staggers, MD [facility].  12/29/17 3:57 PM

## 2017-12-29 NOTE — Patient Instructions (Addendum)
Symptoms today appear to be due to peripheral vertigo.  See information below on vertigo as well as on maneuver to help with vertigo.  Meclizine every 8 hours as needed, Valium if needed for symptoms that are not relieved with meclizine, but be cautious with that medicine as it can also cause dizziness/sedation.   Return to the clinic or go to the nearest emergency room if any of your symptoms worsen or new symptoms occur.  How to Perform the Epley Maneuver The Epley maneuver is an exercise that relieves symptoms of vertigo. Vertigo is the feeling that you or your surroundings are moving when they are not. When you feel vertigo, you may feel like the room is spinning and have trouble walking. Dizziness is a little different than vertigo. When you are dizzy, you may feel unsteady or light-headed. You can do this maneuver at home whenever you have symptoms of vertigo. You can do it up to 3 times a day until your symptoms go away. Even though the Epley maneuver may relieve your vertigo for a few weeks, it is possible that your symptoms will return. This maneuver relieves vertigo, but it does not relieve dizziness. What are the risks? If it is done correctly, the Epley maneuver is considered safe. Sometimes it can lead to dizziness or nausea that goes away after a short time. If you develop other symptoms, such as changes in vision, weakness, or numbness, stop doing the maneuver and call your health care provider. How to perform the Epley maneuver 1. Sit on the edge of a bed or table with your back straight and your legs extended or hanging over the edge of the bed or table. 2. Turn your head halfway toward the affected ear or side. 3. Lie backward quickly with your head turned until you are lying flat on your back. You may want to position a pillow under your shoulders. 4. Hold this position for 30 seconds. You may experience an attack of vertigo. This is normal. 5. Turn your head to the opposite  direction until your unaffected ear is facing the floor. 6. Hold this position for 30 seconds. You may experience an attack of vertigo. This is normal. Hold this position until the vertigo stops. 7. Turn your whole body to the same side as your head. Hold for another 30 seconds. 8. Sit back up. You can repeat this exercise up to 3 times a day. Follow these instructions at home:  After doing the Epley maneuver, you can return to your normal activities.  Ask your health care provider if there is anything you should do at home to prevent vertigo. He or she may recommend that you: ? Keep your head raised (elevated) with two or more pillows while you sleep. ? Do not sleep on the side of your affected ear. ? Get up slowly from bed. ? Avoid sudden movements during the day. ? Avoid extreme head movement, like looking up or bending over. Contact a health care provider if:  Your vertigo gets worse.  You have other symptoms, including: ? Nausea. ? Vomiting. ? Headache. Get help right away if:  You have vision changes.  You have a severe or worsening headache or neck pain.  You cannot stop vomiting.  You have new numbness or weakness in any part of your body. Summary  Vertigo is the feeling that you or your surroundings are moving when they are not.  The Epley maneuver is an exercise that relieves symptoms of vertigo.  If the Epley maneuver is done correctly, it is considered safe. You can do it up to 3 times a day. This information is not intended to replace advice given to you by your health care provider. Make sure you discuss any questions you have with your health care provider. Document Released: 03/02/2013 Document Revised: 01/16/2016 Document Reviewed: 01/16/2016 Elsevier Interactive Patient Education  2017 Elsevier Inc.   Vertigo Vertigo is the feeling that you or your surroundings are moving when they are not. Vertigo can be dangerous if it occurs while you are doing  something that could endanger you or others, such as driving. What are the causes? This condition is caused by a disturbance in the signals that are sent by your body's sensory systems to your brain. Different causes of a disturbance can lead to vertigo, including:  Infections, especially in the inner ear.  A bad reaction to a drug, or misuse of alcohol and medicines.  Withdrawal from drugs or alcohol.  Quickly changing positions, as when lying down or rolling over in bed.  Migraine headaches.  Decreased blood flow to the brain.  Decreased blood pressure.  Increased pressure in the brain from a head or neck injury, stroke, infection, tumor, or bleeding.  Central nervous system disorders.  What are the signs or symptoms? Symptoms of this condition usually occur when you move your head or your eyes in different directions. Symptoms may start suddenly, and they usually last for less than a minute. Symptoms may include:  Loss of balance and falling.  Feeling like you are spinning or moving.  Feeling like your surroundings are spinning or moving.  Nausea and vomiting.  Blurred vision or double vision.  Difficulty hearing.  Slurred speech.  Dizziness.  Involuntary eye movement (nystagmus).  Symptoms can be mild and cause only slight annoyance, or they can be severe and interfere with daily life. Episodes of vertigo may return (recur) over time, and they are often triggered by certain movements. Symptoms may improve over time. How is this diagnosed? This condition may be diagnosed based on medical history and the quality of your nystagmus. Your health care provider may test your eye movements by asking you to quickly change positions to trigger the nystagmus. This may be called the Dix-Hallpike test, head thrust test, or roll test. You may be referred to a health care provider who specializes in ear, nose, and throat (ENT) problems (otolaryngologist) or a provider who  specializes in disorders of the central nervous system (neurologist). You may have additional testing, including:  A physical exam.  Blood tests.  MRI.  A CT scan.  An electrocardiogram (ECG). This records electrical activity in your heart.  An electroencephalogram (EEG). This records electrical activity in your brain.  Hearing tests.  How is this treated? Treatment for this condition depends on the cause and the severity of the symptoms. Treatment options include:  Medicines to treat nausea or vertigo. These are usually used for severe cases. Some medicines that are used to treat other conditions may also reduce or eliminate vertigo symptoms. These include: ? Medicines that control allergies (antihistamines). ? Medicines that control seizures (anticonvulsants). ? Medicines that relieve depression (antidepressants). ? Medicines that relieve anxiety (sedatives).  Head movements to adjust your inner ear back to normal. If your vertigo is caused by an ear problem, your health care provider may recommend certain movements to correct the problem.  Surgery. This is rare.  Follow these instructions at home: Safety  Move slowly.Avoid sudden body  or head movements.  Avoid driving.  Avoid operating heavy machinery.  Avoid doing any tasks that would cause danger to you or others if you would have a vertigo episode during the task.  If you have trouble walking or keeping your balance, try using a cane for stability. If you feel dizzy or unstable, sit down right away.  Return to your normal activities as told by your health care provider. Ask your health care provider what activities are safe for you. General instructions  Take over-the-counter and prescription medicines only as told by your health care provider.  Avoid certain positions or movements as told by your health care provider.  Drink enough fluid to keep your urine clear or pale yellow.  Keep all follow-up visits as  told by your health care provider. This is important. Contact a health care provider if:  Your medicines do not relieve your vertigo or they make it worse.  You have a fever.  Your condition gets worse or you develop new symptoms.  Your family or friends notice any behavioral changes.  Your nausea or vomiting gets worse.  You have numbness or a "pins and needles" sensation in part of your body. Get help right away if:  You have difficulty moving or speaking.  You are always dizzy.  You faint.  You develop severe headaches.  You have weakness in your hands, arms, or legs.  You have changes in your hearing or vision.  You develop a stiff neck.  You develop sensitivity to light. This information is not intended to replace advice given to you by your health care provider. Make sure you discuss any questions you have with your health care provider. Document Released: 12/05/2004 Document Revised: 08/09/2015 Document Reviewed: 06/20/2014 Elsevier Interactive Patient Education  [employer].   If you have lab work done today you will be contacted with your lab results within the next 2 weeks.  If you have not heard from [place] then please contact us. The fastest way to get your results is to register for My Chart.   IF you received an x-ray today, you will receive an invoice from [facility]. Please contact [facility] at [phone] with questions or concerns regarding your invoice.   IF you received labwork today, you will receive an invoice from [place]. Please contact [facility] at [phone] with questions or concerns regarding your invoice.   Our billing staff will not be able to assist you with questions regarding bills from these companies.  You will be contacted with the lab results as soon as they are available. The fastest way to get your results is to activate your My Chart account. Instructions are located on the last page of this paperwork. If  you have not heard from [place] regarding the results in 2 weeks, please contact this office.

## 2020-01-14 ENCOUNTER — Encounter: Payer: Self-pay | Admitting: Family Medicine

## 2020-01-14 ENCOUNTER — Ambulatory Visit (INDEPENDENT_AMBULATORY_CARE_PROVIDER_SITE_OTHER): Payer: Medicare Other | Admitting: Family Medicine

## 2020-01-14 ENCOUNTER — Other Ambulatory Visit: Payer: Self-pay

## 2020-01-14 VITALS — BP 140/79 | HR 90 | Temp 97.8°F | Ht 72.0 in | Wt 224.4 lb

## 2020-01-14 DIAGNOSIS — E1165 Type 2 diabetes mellitus with hyperglycemia: Secondary | ICD-10-CM

## 2020-01-14 DIAGNOSIS — N920 Excessive and frequent menstruation with regular cycle: Secondary | ICD-10-CM

## 2020-01-14 DIAGNOSIS — E119 Type 2 diabetes mellitus without complications: Secondary | ICD-10-CM

## 2020-01-14 DIAGNOSIS — R350 Frequency of micturition: Secondary | ICD-10-CM | POA: Diagnosis not present

## 2020-01-14 DIAGNOSIS — R81 Glycosuria: Secondary | ICD-10-CM

## 2020-01-14 LAB — POCT URINALYSIS DIP (CLINITEK)
Bilirubin, UA: NEGATIVE
Blood, UA: NEGATIVE
Glucose, UA: >=1000 mg/dL — AB
Leukocytes, UA: NEGATIVE
Nitrite, UA: NEGATIVE
POC PROTEIN,UA: NEGATIVE
Spec Grav, UA: 1.02 (ref 1.010–1.025)
Urobilinogen, UA: 0.2 E.U./dL
pH, UA: 5 (ref 5.0–8.0)

## 2020-01-14 LAB — POCT GLYCOSYLATED HEMOGLOBIN (HGB A1C): Hemoglobin A1C: 12.9 % — AB (ref 4.0–5.6)

## 2020-01-14 LAB — GLUCOSE, POCT (MANUAL RESULT ENTRY): POC Glucose: 437 mg/dl — AB (ref 70–99)

## 2020-01-14 MED ORDER — METFORMIN HCL 500 MG PO TABS: ORAL_TABLET | ORAL | 3 refills | Status: DC

## 2020-01-14 MED ORDER — SITAGLIPTIN PHOSPHATE 50 MG PO TABS: 50.0000 mg | ORAL_TABLET | Freq: Every day | ORAL | 5 refills | Status: DC

## 2020-01-14 MED ORDER — BLOOD GLUCOSE METER KIT: PACK | 0 refills | Status: AC

## 2020-01-14 NOTE — Progress Notes (Signed)
Patient ID: Craig Nelson, male    DOB: 1955-10-27  Age: 64 y.o. MRN: [id number]  Chief Complaint  Patient presents with  . Urinary Frequency    patient has been having frequent urination for 3 weeks.     Subjective:  Patient has been having a lot of thirst and frequent urination.  He has been urinating 5 or 6 times a night.  He has lost 20 to 30 pounds over the recent months.  There is a strong family history of diabetes.  Otherwise he is feeling well.  Is not on any regular medications.  He has not had his Covid vaccinations and I had a long talk with him regarding this.  He has taken a strong stance against the vaccine.  Current allergies, medications, problem list, past/family and social histories reviewed.  Objective:  BP 140/79 (BP Location: Right Arm, Patient Position: Sitting, Cuff Size: Large)   Pulse 90   Temp 97.8 F (36.6 C) (Temporal)   Ht 6' (1.829 m)   Wt 224 lb 6.4 oz (101.8 kg)   SpO2 99%   BMI 30.43 kg/m  Well-developed well-nourished alert man. HEENT: TMs normal.  Throat clear.  Neck supple without nodes or thyromegaly.  Chest clear.  Heart regular without murmurs.  No ankle edema.  Assessment & Plan:   Assessment: 1. New onset type 2 diabetes mellitus ([place])   2. Excessive or frequent menstruation   3. Frequency of urination   4. Glucosuria       Plan: See instructions  Orders Placed This Encounter  Procedures  . Comprehensive metabolic panel  . TSH  . Lipid panel  . POCT URINALYSIS DIP (CLINITEK)  . POCT glucose (manual entry)  . POCT glycosylated hemoglobin (Hb A1C)    Meds ordered this encounter  Medications  . blood glucose meter kit and supplies    Sig: Dispense based on patient and insurance preference. Use up to four times daily as directed. (FOR ICD-10 E10.9, E11.9).    Dispense:  1 each    Refill:  0    Order Specific Question:   Number of strips    Answer:   100    Order Specific Question:   Number of lancets    Answer:   100  .  metFORMIN (GLUCOPHAGE) 500 MG tablet    Sig: Take 1 twice daily for 1 week, then increase to 2 twice daily at breakfast and supper.    Dispense:  360 tablet    Refill:  3  . sitaGLIPtin (JANUVIA) 50 MG tablet    Sig: Take 1 tablet (50 mg total) by mouth daily.    Dispense:  30 tablet    Refill:  5         Patient Instructions     Go to the American diabetes Association website and read as much as you can about diabetes and diabetic diet and exercise so that you can start managing your problems well.  The goal is to have the hemoglobin A1c down around the 7 level.  A nondiabetic is usually 5.5 or less but a hemoglobin A1c of greater than 6.5 is one of the definitions of diabetes.  Watch your diet and get regular exercise  Take Metformin 500 mg 1 twice daily for 1 week, then increase to 2 twice daily with breakfast and supper.  In about 2-3 weeks begin the Januvia 50 mg 1 daily  Return in 1 month for a recheck.  You need to get yourself  established with a regular physician.  I am mostly retired and part-time so cannot be Data processing manager.  Speak to the people at the desk.  I think I understood that you have an upcoming appointment and if so just plan to keep that.  Although you did not come in here regarding the Covid vaccine, you and I have discussed it.  I once again strongly urged you reconsider and change your mind and get this most helpful and valid immunization done.  I encourage you to read the ethics statements from the [place] and dental Association.  If you go to the following website: http://www.cox-reed.biz/   you can scroll down to the line item of vaccines and there is a very good statement accompanied with scripture references.  If you have lab work done today you will be contacted with your lab results within the next 2 weeks.  If you have not heard from [place] then please contact us. The fastest way to get your results is to  register for My Chart.   IF you received an x-ray today, you will receive an invoice from [facility]. Please contact [facility] at [phone] with questions or concerns regarding your invoice.   IF you received labwork today, you will receive an invoice from [place]. Please contact [facility] at [phone] with questions or concerns regarding your invoice.   Our billing staff will not be able to assist you with questions regarding bills from these companies.  You will be contacted with the lab results as soon as they are available. The fastest way to get your results is to activate your My Chart account. Instructions are located on the last page of this paperwork. If you have not heard from [place] regarding the results in 2 weeks, please contact this office.         Return in about 4 weeks (around 02/11/2020), or if symptoms worsen or fail to improve.   Ruben Reason, MD 01/14/2020

## 2020-01-14 NOTE — Patient Instructions (Addendum)
   Go to the American diabetes Association website and read as much as you can about diabetes and diabetic diet and exercise so that you can start managing your problems well.  The goal is to have the hemoglobin A1c down around the 7 level.  A nondiabetic is usually 5.5 or less but a hemoglobin A1c of greater than 6.5 is one of the definitions of diabetes.  Watch your diet and get regular exercise  Take Metformin 500 mg 1 twice daily for 1 week, then increase to 2 twice daily with breakfast and supper.  In about 2-3 weeks begin the Januvia 50 mg 1 daily  Return in 1 month for a recheck.  You need to get yourself established with a regular physician.  I am mostly retired and part-time so cannot be Web designer.  Speak to the people at the desk.  I think I understood that you have an upcoming appointment and if so just plan to keep that.  Although you did not come in here regarding the Covid vaccine, you and I have discussed it.  I once again strongly urged you reconsider and change your mind and get this most helpful and valid immunization done.  I encourage you to read the ethics statements from the [facility] medical and dental Association.  If you go to the following website: http://www.king-[place]uyen.com/   you can scroll down to the line item of vaccines and there is a very good statement accompanied with scripture references.  If you have lab work done today you will be contacted with your lab results within the next 2 weeks.  If you have not heard from [place] then please contact us. The fastest way to get your results is to register for My Chart.   IF you received an x-ray today, you will receive an invoice from [facility]. Please contact [facility] at [phone] with questions or concerns regarding your invoice.   IF you received labwork today, you will receive an invoice from [place]. Please contact [facility] at [phone] with questions  or concerns regarding your invoice.   Our billing staff will not be able to assist you with questions regarding bills from these companies.  You will be contacted with the lab results as soon as they are available. The fastest way to get your results is to activate your My Chart account. Instructions are located on the last page of this paperwork. If you have not heard from [place] regarding the results in 2 weeks, please contact this office.

## 2020-01-15 LAB — TSH: TSH: 3.22 u[IU]/mL (ref 0.450–4.500)

## 2020-01-15 LAB — LIPID PANEL
Chol/HDL Ratio: 6.8 ratio — ABNORMAL HIGH (ref 0.0–5.0)
Cholesterol, Total: 198 mg/dL (ref 100–199)
HDL: 29 mg/dL — ABNORMAL LOW (ref >39)
LDL Chol Calc (NIH): 63 mg/dL (ref 0–99)
Triglycerides: 701 mg/dL (ref 0–149)
VLDL Cholesterol Cal: 106 mg/dL — ABNORMAL HIGH (ref 5–40)

## 2020-01-15 LAB — COMPREHENSIVE METABOLIC PANEL
ALT: 34 IU/L (ref 0–44)
AST: 24 IU/L (ref 0–40)
Albumin/Globulin Ratio: 1.6 (ref 1.2–2.2)
Albumin: 4.3 g/dL (ref 3.8–4.8)
Alkaline Phosphatase: 132 IU/L — ABNORMAL HIGH (ref 44–121)
BUN/Creatinine Ratio: 17 (ref 10–24)
BUN: 17 mg/dL (ref 8–27)
Bilirubin Total: 0.6 mg/dL (ref 0.0–1.2)
CO2: 23 mmol/L (ref 20–29)
Calcium: 8.9 mg/dL (ref 8.6–10.2)
Chloride: 93 mmol/L — ABNORMAL LOW (ref 96–106)
Creatinine, Ser: 1.01 mg/dL (ref 0.76–1.27)
GFR calc Af Amer: 90 mL/min/{1.73_m2} (ref >59)
GFR calc non Af Amer: 78 mL/min/{1.73_m2} (ref >59)
Globulin, Total: 2.7 g/dL (ref 1.5–4.5)
Glucose: 429 mg/dL — ABNORMAL HIGH (ref 65–99)
Potassium: 4.3 mmol/L (ref 3.5–5.2)
Sodium: 131 mmol/L — ABNORMAL LOW (ref 134–144)
Total Protein: 7 g/dL (ref 6.0–8.5)

## 2020-01-16 NOTE — Progress Notes (Signed)
Call:  As we discussed , your diabetes is in bad control but should improve with the medication changes.  Watch your diet.  Exercise.  Return in a month as directed, sooner if needed.  The very high triglycerides will improve when the sugar improves.  Sandria Bales. Sheranda Seabrooks MD  Recommend to him that he get my chart.

## 2020-01-17 ENCOUNTER — Other Ambulatory Visit: Payer: Self-pay | Admitting: Family Medicine

## 2020-01-19 ENCOUNTER — Telehealth: Payer: Self-pay | Admitting: Family Medicine

## 2020-01-19 MED ORDER — ACCU-CHEK AVIVA PLUS VI STRP: ORAL_STRIP | 12 refills | Status: DC

## 2020-01-19 MED ORDER — ACCU-CHEK MULTICLIX LANCETS MISC: 12 refills | Status: AC

## 2020-01-19 MED ORDER — ACCU-CHEK AVIVA DEVI: 0 refills | Status: AC

## 2020-01-19 NOTE — Telephone Encounter (Signed)
Pt. Came into office with prescription blood glucose meter kit and supplies [[id number]] .   His pharmacy told him it has to be written as two different prescriptions (1 for the meter kit, and one for the supplies). Pt. Wants prescriptions sent to : [id number] [place] [id number] Phone:  [phone]:  [phone] DEA #:  [id number] Please advise at [phone]

## 2020-01-19 NOTE — Telephone Encounter (Signed)
Called [facility] [address] and they stated that they have not filled for this pt before.   I have called pt to get more clarification and we will go ahead and send the Glucose meter and the supplies separately per pt and pharmacy request.   I sent in the ACCU-CHECK AVIVA

## 2020-02-15 ENCOUNTER — Ambulatory Visit: Payer: Medicare Other | Admitting: Emergency Medicine

## 2020-02-15 ENCOUNTER — Other Ambulatory Visit: Payer: Self-pay

## 2020-02-15 DIAGNOSIS — Z0000 Encounter for general adult medical examination without abnormal findings: Secondary | ICD-10-CM

## 2020-02-15 LAB — CMP14+EGFR
ALT: 40 IU/L (ref 0–44)
AST: 32 IU/L (ref 0–40)
Albumin/Globulin Ratio: 1.7 (ref 1.2–2.2)
Albumin: 4.7 g/dL (ref 3.8–4.8)
Alkaline Phosphatase: 107 IU/L (ref 44–121)
BUN/Creatinine Ratio: 18 (ref 10–24)
BUN: 19 mg/dL (ref 8–27)
Bilirubin Total: 0.4 mg/dL (ref 0.0–1.2)
CO2: 24 mmol/L (ref 20–29)
Calcium: 9.3 mg/dL (ref 8.6–10.2)
Chloride: 105 mmol/L (ref 96–106)
Creatinine, Ser: 1.06 mg/dL (ref 0.76–1.27)
GFR calc Af Amer: 85 mL/min/{1.73_m2} (ref >59)
GFR calc non Af Amer: 73 mL/min/{1.73_m2} (ref >59)
Globulin, Total: 2.7 g/dL (ref 1.5–4.5)
Glucose: 121 mg/dL — ABNORMAL HIGH (ref 65–99)
Potassium: 5.6 mmol/L — ABNORMAL HIGH (ref 3.5–5.2)
Sodium: 144 mmol/L (ref 134–144)
Total Protein: 7.4 g/dL (ref 6.0–8.5)

## 2020-02-15 LAB — CBC
Hematocrit: 44.2 % (ref 37.5–51.0)
Hemoglobin: 15.3 g/dL (ref 13.0–17.7)
MCH: 31.1 pg (ref 26.6–33.0)
MCHC: 34.6 g/dL (ref 31.5–35.7)
MCV: 90 fL (ref 79–97)
Platelets: 287 10*3/uL (ref 150–450)
RBC: 4.92 x10E6/uL (ref 4.14–5.80)
RDW: 12.1 % (ref 11.6–15.4)
WBC: 5.6 10*3/uL (ref 3.4–10.8)

## 2020-02-15 LAB — LIPID PANEL
Chol/HDL Ratio: 4.6 ratio (ref 0.0–5.0)
Cholesterol, Total: 216 mg/dL — ABNORMAL HIGH (ref 100–199)
HDL: 47 mg/dL (ref >39)
LDL Chol Calc (NIH): 146 mg/dL — ABNORMAL HIGH (ref 0–99)
Triglycerides: 130 mg/dL (ref 0–149)
VLDL Cholesterol Cal: 23 mg/dL (ref 5–40)

## 2020-02-17 ENCOUNTER — Ambulatory Visit (INDEPENDENT_AMBULATORY_CARE_PROVIDER_SITE_OTHER): Payer: Medicare Other | Admitting: Family Medicine

## 2020-02-17 ENCOUNTER — Other Ambulatory Visit: Payer: Self-pay

## 2020-02-17 ENCOUNTER — Encounter: Payer: Self-pay | Admitting: Family Medicine

## 2020-02-17 VITALS — BP 138/82 | HR 78 | Temp 98.0°F | Resp 15 | Ht 72.0 in | Wt 225.0 lb

## 2020-02-17 DIAGNOSIS — E119 Type 2 diabetes mellitus without complications: Secondary | ICD-10-CM

## 2020-02-17 DIAGNOSIS — Z1211 Encounter for screening for malignant neoplasm of colon: Secondary | ICD-10-CM | POA: Diagnosis not present

## 2020-02-17 DIAGNOSIS — Z0000 Encounter for general adult medical examination without abnormal findings: Secondary | ICD-10-CM

## 2020-02-17 DIAGNOSIS — Z0001 Encounter for general adult medical examination with abnormal findings: Secondary | ICD-10-CM | POA: Diagnosis not present

## 2020-02-17 NOTE — Progress Notes (Signed)
Patient ID: Craig Nelson, male    DOB: [birth year]  Age: 64 y.o. MRN: [id number]  Chief Complaint  Patient presents with  . Annual Exam    Pt here for physical he would like to check on BS findings as he reports BG okay at home as of recent    Subjective:   Craig Nelson is a 64 year old man who is here for his annual physical examination.  He has no major acute complaints.  He was seen by me a month ago with severe uncontrolled new diagnosis diabetes.  He has been watching his weight, getting exercise, taking his medicine, and feels much better.  He had some questions about his medicine which we discussed.  I instructed him that he was to keep taking both of them.  If his hemoglobin A1c comes down well we may be able to get him off of the Januvia, but for now I want him on both.  Past medical history: Operations: None Hospitalizations: In [place] he was hospitalized for stress and I believe here also.  I am not sure the details on that. 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  He has been a past for 28 years.  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  He is married with 4 kids and 4 grandkids.  He goes walking 3 miles a day now since the diagnosis of diabetes.  Family history: No major diseases reported  Review of systems: Constitutional: Feels well HEENT: Unremarkable Cardiovascular: Unremarkable Respiratory: Unremarkable GI: Unremarkable GU: Urinary frequency has decreased and he is feeling fine.  Sexual function is not great. Musculoskeletal: Right knee greater than the left in size and it hurts him some. Dermatologic: Unremarkable Psychiatric: Anxiety and stress Neurological: Unremarkable Endocrine: Brought in his sugar records.  They are doing much better.  Morning sugars are down to the 120s a lot of the time  now.  Current allergies, medications, problem list, past/family and social histories reviewed.  Objective:  BP 138/82   Pulse 78   Temp 98 F (36.7 C) (Temporal)   Resp 15   Ht 6' (1.829 m)   Wt 225 lb (102.1 kg)   SpO2 98%   BMI 30.52 kg/m   Man alert and oriented.  TMs normal.  Throat clear.  Missing a lot of teeth.  He does not wear his partial.  Neck supple without nodes or thyromegaly.  No carotid bruits.  Chest is clear to auscultation.  Heart regular without murmurs gallops or arrhythmias noted.  No axillary or inguinal nodes.  Abdomen was a little overweight, soft without mass or tenderness.  Normal male external genitalia.  Circumcised.  No hernias.  Extremities have a little bit more swelling in the right leg, more an arthritic enlargement, of the right knee.  No ankle edema.  Patient has not had a colonoscopy ever.  Assessment & Plan:   Assessment: 1. Annual physical exam   2. New onset type 2 diabetes mellitus ([facility])       Plan: See instructions.  Had a long discussion with him trying to explain why he should get a Covid vaccination but he seems to be determined to avoid them.  I forgot to tell him we are scheduling a colonoscopy though we discussed that.  I will go ahead and schedule it.  No orders of the  defined types were placed in this encounter.   No orders of the defined types were placed in this encounter.        Patient Instructions   Continue taking the Metformin 500 mg 2 pills twice daily.  I gave you 90-day prescription so you only need to get it every 3 months  Continue take the Januvia 50 mg once daily.  I only gave you 30 days at a time of the Januvia so you need to pick those prescriptions up monthly.  As per our discussion I strongly urge you to reconsider and get your Covid vaccinations.  I think there is a lot of misinformation about their.  All of the data strongly supports the medication and its protection for you.  Continue getting  regular exercise.  In about 3 months we will repeat your labs for the hemoglobin A1c and see if it is coming down from the very high level it was initially.  Then we will decide if continuing the Januvia is necessary.  Return sooner if problems  As per our discussion, remember to seek to be physically, emotionally, relationally, and spiritually healthy.  A good Internet site to read is the American diabetes Association website www.diabetes.org    If you have lab work done today you will be contacted with your lab results within the next 2 weeks.  If you have not heard from [place] then please contact us. The fastest way to get your results is to register for My Chart.   IF you received an x-ray today, you will receive an invoice from [facility]. Please contact [facility] at [phone] with questions or concerns regarding your invoice.   IF you received labwork today, you will receive an invoice from [place]. Please contact [facility] at [phone] with questions or concerns regarding your invoice.   Our billing staff will not be able to assist you with questions regarding bills from these companies.  You will be contacted with the lab results as soon as they are available. The fastest way to get your results is to activate your My Chart account. Instructions are located on the last page of this paperwork. If you have not heard from [place] regarding the results in 2 weeks, please contact this office.        Return in about 3 months (around 05/17/2020) for Diabetes recheck.   Janace Hoard, MD 02/17/2020

## 2020-02-17 NOTE — Patient Instructions (Addendum)
Continue taking the Metformin 500 mg 2 pills twice daily.  I gave you 90-day prescription so you only need to get it every 3 months  Continue take the Januvia 50 mg once daily.  I only gave you 30 days at a time of the Januvia so you need to pick those prescriptions up monthly.  As per our discussion I strongly urge you to reconsider and get your Covid vaccinations.  I think there is a lot of misinformation about their.  All of the data strongly supports the medication and its protection for you.  Continue getting regular exercise.  In about 3 months we will repeat your labs for the hemoglobin A1c and see if it is coming down from the very high level it was initially.  Then we will decide if continuing the Januvia is necessary.  Return sooner if problems  As per our discussion, remember to seek to be physically, emotionally, relationally, and spiritually healthy.  A good Internet site to read is the American diabetes Association website www.diabetes.org    If you have lab work done today you will be contacted with your lab results within the next 2 weeks.  If you have not heard from [place] then please contact us. The fastest way to get your results is to register for My Chart.   IF you received an x-ray today, you will receive an invoice from [facility]. Please contact [facility] at [phone] with questions or concerns regarding your invoice.   IF you received labwork today, you will receive an invoice from [facility]. Please contact [facility] at [phone] with questions or concerns regarding your invoice.   Our billing staff will not be able to assist you with questions regarding bills from these companies.  You will be contacted with the lab results as soon as they are available. The fastest way to get your results is to activate your My Chart account. Instructions are located on the last page of this paperwork. If you have not heard from [place] regarding the results in 2  weeks, please contact this office.

## 2020-05-11 ENCOUNTER — Encounter: Payer: Self-pay | Admitting: Family Medicine

## 2020-05-11 ENCOUNTER — Other Ambulatory Visit: Payer: Self-pay

## 2020-05-11 ENCOUNTER — Ambulatory Visit (INDEPENDENT_AMBULATORY_CARE_PROVIDER_SITE_OTHER): Payer: Medicare Other | Admitting: Family Medicine

## 2020-05-11 VITALS — BP 122/75 | HR 62 | Temp 97.6°F | Ht 72.0 in | Wt 213.0 lb

## 2020-05-11 DIAGNOSIS — E785 Hyperlipidemia, unspecified: Secondary | ICD-10-CM | POA: Diagnosis not present

## 2020-05-11 DIAGNOSIS — E1165 Type 2 diabetes mellitus with hyperglycemia: Secondary | ICD-10-CM | POA: Diagnosis not present

## 2020-05-11 DIAGNOSIS — E119 Type 2 diabetes mellitus without complications: Secondary | ICD-10-CM

## 2020-05-11 LAB — POCT GLYCOSYLATED HEMOGLOBIN (HGB A1C): Hemoglobin A1C: 6.2 % — AB (ref 4.0–5.6)

## 2020-05-11 MED ORDER — ATORVASTATIN CALCIUM 10 MG PO TABS: 10.0000 mg | ORAL_TABLET | Freq: Every day | ORAL | 1 refills | Status: DC

## 2020-05-11 NOTE — Progress Notes (Signed)
Subjective:  Patient ID: Tayson Schnelle, male    DOB: May 09, 1955  Age: 65 y.o. MRN: [id number]  CC:  Chief Complaint  Patient presents with  . Transitions Of Care    PT reports he feels great. Pt reports cutting out sugar from his diet. PT reports walking 7 miles a day when he can. Pt states he does his best to keep in BS Below 120 mg/dL range. PT reports not taking Januvia due to pt's research he found that the medication was dangerous and doesn't want to take it.    HPI Doren Kaspar presents for   Transition of care.  I last saw him in 2019 for acute issue at that time.  Physical with Dr. Linna Darner in December.  New diagnosis by diabetes few months ago.  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  Retired.  Diabetes: With hyperglycemia, new diagnosis with A1c 12.9 November 5.  Borderline diabetes with A1c 6.5 in 2012.  Initially presented in January 14, 2020 with increased thirst, polyuria.  20-30 pound weight loss. Started on Metformin 500 mg twice daily initially with plan to increase to 1000 mg twice daily.  Januvia 50 mg daily. Follow-up December ninth.  Improved diet.  Improved exercise.  Walking 3 miles per day Referred for colonoscopy at his December 7 visit. LDL 146 in December, not on statin.  Home readings from 1 12-1 20s.  Takes Metformin 1000 mg every afternoon, then 500 mg in the morning few days per week, 1000 mg if his fasting readings over 120.  He is still exercising walking 7 miles per day 3 to 4 days/week.  Some sob work with Architect on days as well.  He did not take Januvia.  Microalbumin: Will check today  Agrees to ophthalmology referral for retinopathy screening.  Defers Pneumovax at this time.  Will consider at next visit. Statin with diabetes discussed, including potential side effects and risks.  Agrees to trial.  Diabetic Foot Exam - Simple   No data filed      Lab Results  Component Value Date    HGBA1C 12.9 (A) 01/14/2020   HGBA1C (H) 03/16/2010    6.5 (NOTE)                                                                       According to the ADA Clinical Practice Recommendations for 2011, when HbA1c is used as a screening test:   >=6.5%   Diagnostic of Diabetes Mellitus           (if abnormal result  is confirmed)  5.7-6.4%   Increased risk of developing Diabetes Mellitus  References:Diagnosis and Classification of Diabetes Mellitus,Diabetes FAOZ,3086,57(QIONG 1):S62-S69 and Standards of Medical Care in         Diabetes - 2011,Diabetes Care,2011,34  (Suppl 1):S11-S61.   Lab Results  Component Value Date   LDLCALC 146 (H) 02/15/2020   CREATININE 1.06 02/15/2020     History Patient Active Problem List   Diagnosis Date Noted  . Obesity 04/06/2014  . Erectile dysfunction 04/06/2014   Past Medical History:  Diagnosis Date  . Allergy    No past surgical history on file. No Known Allergies Prior  to Admission medications   Medication Sig Start Date End Date Taking? Authorizing Provider  acetaminophen (TYLENOL) 500 MG tablet Take 1 tablet (500 mg total) by mouth every 6 (six) hours as needed. 09/07/16  Yes Leonie Douglas, PA-C  aspirin EC 81 MG tablet Take 81 mg by mouth daily. Swallow whole.   Yes [provider]  blood glucose meter kit and supplies Dispense based on patient and insurance preference. Use up to four times daily as directed. (FOR ICD-10 E10.9, E11.9). 01/14/20  Yes Posey Boyer, MD  Blood Glucose Monitoring Suppl (ACCU-CHEK AVIVA) device Use as instructed, Check before eating up to 3 times daily DX: E11.9 01/19/20 01/18/21 Yes Wendie Agreste, MD  glucose blood (ACCU-CHEK AVIVA PLUS) test strip Use as instructed. Check before eating up to 3 times daily DX: E11.9 01/19/20  Yes Wendie Agreste, MD  Lancets (ACCU-CHEK MULTICLIX) lancets Use as instructed, Check before eating up to 3 times daily DX: E11.9 01/19/20  Yes Wendie Agreste, MD   metFORMIN (GLUCOPHAGE) 500 MG tablet Take 1 twice daily for 1 week, then increase to 2 twice daily at breakfast and supper. 01/14/20  Yes Posey Boyer, MD  sitaGLIPtin (JANUVIA) 50 MG tablet Take 1 tablet (50 mg total) by mouth daily. Patient not taking: Reported on 05/11/2020 01/14/20   Posey Boyer, MD   Social History   Socioeconomic History  . Marital status: Married    Spouse name: Not on file  . Number of children: Not on file  . Years of education: Not on file  . Highest education level: Not on file  Occupational History  . Not on file  Tobacco Use  . Smoking status: Never Smoker  . Smokeless tobacco: Never Used  Substance and Sexual Activity  . Alcohol use: No  . Drug use: No  . Sexual activity: Yes  Other Topics Concern  . Not on file  Social History Narrative  . Not on file   Social Determinants of Health   Financial Resource Strain: Not on file  Food Insecurity: Not on file  Transportation Needs: Not on file  Physical Activity: Not on file  Stress: Not on file  Social Connections: Not on file  Intimate Partner Violence: Not on file    Review of Systems  Constitutional: Negative for fatigue and unexpected weight change.  Eyes: Negative for visual disturbance.  Respiratory: Negative for cough, chest tightness and shortness of breath.   Cardiovascular: Negative for chest pain, palpitations and leg swelling.  Gastrointestinal: Negative for abdominal pain and blood in stool.  Neurological: Negative for dizziness, light-headedness and headaches.     Objective:   Vitals:   05/11/20 1447  BP: 122/75  Pulse: 62  Temp: 97.6 F (36.4 C)  TempSrc: Temporal  Weight: 213 lb (96.6 kg)  Height: 6' (1.829 m)     Physical Exam Vitals reviewed.  Constitutional:      Appearance: He is well-developed and well-nourished.  HENT:     Head: Normocephalic and atraumatic.  Eyes:     Extraocular Movements: EOM normal.     Pupils: Pupils are equal, round, and  reactive to light.  Neck:     Vascular: No carotid bruit or JVD.  Cardiovascular:     Rate and Rhythm: Normal rate and regular rhythm.     Heart sounds: Normal heart sounds. No murmur heard.   Pulmonary:     Effort: Pulmonary effort is normal.     Breath sounds:  Normal breath sounds. No rales.  Musculoskeletal:        General: No edema.     Right lower leg: No edema.     Left lower leg: No edema.  Skin:    General: Skin is warm and dry.  Neurological:     Mental Status: He is alert and oriented to person, place, and time.  Psychiatric:        Mood and Affect: Mood and affect normal.     Results for orders placed or performed in visit on 05/11/20  POCT glycosylated hemoglobin (Hb A1C)  Result Value Ref Range   Hemoglobin A1C 6.2 (A) 4.0 - 5.6 %   HbA1c POC (<> result, manual entry)     HbA1c, POC (prediabetic range)     HbA1c, POC (controlled diabetic range)         Assessment & Plan:  Jaquann Guarisco is a 65 y.o. male . New onset type 2 diabetes mellitus ([place]) - Plan: Ambulatory referral to Ophthalmology, Comprehensive metabolic panel, Lipid panel, Microalbumin / creatinine urine ratio, POCT glycosylated hemoglobin (Hb A1C) Type 2 diabetes mellitus with hyperglycemia, without long-term current use of insulin ([place]) - Plan: Ambulatory referral to Ophthalmology, Microalbumin / creatinine urine ratio, POCT glycosylated hemoglobin (Hb A1C)  -Presses improvement in control, commended on diet, activity changes.  Continue Metformin 500 mg every morning, 1000 mg every afternoon for now with option of 1000 mg every morning dosing of higher readings.  Recheck levels in 3 months.  Check labs above  Hyperlipidemia, unspecified hyperlipidemia type - Plan: Lipid panel, atorvastatin (LIPITOR) 10 MG tablet  -Start statin, potential side effects and risks of meds discussed.  Recheck levels next visit.  No orders of the defined types were placed in this encounter.  Patient Instructions   I  do recommend pneumonia vaccine.  I will refer you to eye specialist.  Cholesterol rechecked, but recommend Lipitor initially once per week and increase to daily if tolerated. If any new side effects, can stop med and return to discuss. Keep up the good work with diet and exercise!  A1c is much better today.  You can continue Metformin 1 pill in the morning, twice in the evening.  If higher readings, can take second pill in the morning.  Recheck in 3 months.  Let me know if there are questions in the meantime    Type 2 Diabetes Mellitus, Self-Care, Adult Caring for yourself after you have been diagnosed with type 2 diabetes (type 2 diabetes mellitus) means keeping your blood sugar (glucose) under control with a balance of:  Nutrition.  Exercise.  Lifestyle changes.  Medicines or insulin, if needed.  Support from your team of health care providers and others. The following information explains what you need to know to manage your diabetes at home. What are the risks? Having diabetes can put you at risk for other long-term (chronic) conditions, such as heart disease and kidney disease. Your health care provider may prescribe medicines to help prevent complications from diabetes. How to monitor blood glucose  Check your blood glucose every day, as often as told by your health care provider.  Have your A1C (hemoglobin A1C) level checked two or more times a year, or as often as told by your health care provider.  Your health care provider will set personalized treatment goals for you. Generally, the goal of treatment is to maintain the following blood glucose levels: ? Before meals: 80-130 mg/dL (4.4-7.2 mmol/L). ? After meals: below 180  mg/dL (10 mmol/L). ? A1C level: less than 7%.   How to manage hyperglycemia and hypoglycemia Hyperglycemia symptoms Hyperglycemia, also called high blood glucose, occurs when blood glucose is too high. Make sure you know the early signs of hyperglycemia,  such as:  Increased thirst.  Hunger.  Feeling very tired.  Needing to urinate more often than usual.  Blurry vision. Hypoglycemia symptoms Hypoglycemia, also called low blood glucose, occurs with a blood glucose level at or below 70 mg/dL (3.9 mmol/L). Diabetes medicines lower your blood glucose and can cause hypoglycemia. The risk for hypoglycemia increases during or after exercise, during sleep, during illness, and when skipping meals or not eating for a long time (fasting). It is important to know the symptoms of hypoglycemia and treat it right away. Always have a 15-gram rapid-acting carbohydrate snack with you to treat low blood glucose. Family members and close friends should also know the symptoms and understand how to treat hypoglycemia, in case you are not able to treat yourself. Symptoms may include:  Hunger.  Anxiety.  Sweating and feeling clammy.  Dizziness or feeling light-headed.  Sleepiness.  Increased heart rate.  Irritability.  Tingling or numbness around the mouth, lips, or tongue.  Restless sleep. Severe hypoglycemia is when your blood glucose level is at or below 54 mg/dL (3 mmol/L). Severe hypoglycemia is an emergency. Do not wait to see if the symptoms will go away. Get medical help right away. Call your local emergency services (911 in the [place] Do not drive yourself to the hospital. If you have severe hypoglycemia and you cannot eat or drink, you may need glucagon. A family member or close friend should learn how to check your blood glucose and how to give you glucagon. Ask your health care provider if you need to have an emergency glucagon kit available. Follow these instructions at home: Medicines  Take diabetes medicines as told by your health care provider. If your health care provider prescribed insulin or diabetes medicines, take them every day.  Do not run out of insulin or other diabetes medicines. Plan ahead so you always have these  available.  If you use insulin, adjust your dosage based on your physical activity and what foods you eat. Your health care provider will tell you how to adjust your dosage.  Take over-the-counter and prescription medicines only as told by your health care provider. Eating and drinking What you eat and drink affects your blood glucose and your insulin dosage. Making good choices helps to control your diabetes and prevent other health problems. A healthy meal plan includes eating lean proteins, complex carbohydrates, fresh fruits and vegetables, low-fat dairy products, and healthy fats. Make an appointment to see a registered dietitian to help you create an eating plan that is right for you. Make sure that you:  Follow instructions from your health care provider about eating or drinking restrictions.  Drink enough fluid to keep your urine pale yellow.  Keep a record of the carbohydrates that you eat. Do this by reading food labels and learning the standard serving sizes of foods.  Follow your sick-day plan whenever you cannot eat or drink as usual. Make this plan in advance with your health care provider.   Activity  Stay active. Exercise regularly, as told by your health care provider. This may include: ? Stretching and doing strength exercises, such as yoga or weight lifting, 2 or more times a week. ? Doing 150 minutes or more of moderate-intensity or vigorous-intensity exercise each  week. This could be brisk walking, biking, or water aerobics.  Spread out your activity over 3 or more days of the week.  Do not go more than 2 days in a row without doing some kind of physical activity.  When you start a new exercise or activity, work with your health care provider to adjust your insulin, medicines, or food intake as needed. Lifestyle  Do not use any products that contain nicotine or tobacco, such as cigarettes, e-cigarettes, and chewing tobacco. If you need help quitting, ask your health  care provider.  If your health care provider says that alcohol is safe for you, limit how much you use to no more than 1 drink a day for women who are not pregnant and 2 drinks a day for men. In the [place] one drink equals one 12 oz bottle of beer (355 mL), one 5 oz glass of wine (148 mL), or one 1 oz glass of hard liquor (44 mL).  Learn to manage stress. If you need help with this, ask your health care provider. Take care of your body  Keep your immunizations up to date. In addition to getting vaccinations as told by your health care provider, it is recommended that you get vaccinated against the following illnesses: ? The flu (influenza). Get a flu shot every year. ? Pneumonia. ? Hepatitis B.  Schedule an eye exam soon after your diagnosis, and then one time every year after that.  Check your skin and feet every day for cuts, bruises, redness, blisters, or sores. Schedule a foot exam with your health care provider once every year.  Brush your teeth and gums two times a day, and floss one or more times a day. Visit your dentist one or more times every 6 months.  Maintain a healthy weight.   General instructions  Share your diabetes management plan with people in your workplace, school, and household.  Carry a medical alert card or wear medical alert jewelry.  Keep all follow-up visits as told by your health care provider. This is important. Questions to ask your health care provider  Should I meet with a certified diabetes care and education specialist?  Where can I find a support group for people with diabetes? Where to find more information  American Diabetes Association (ADA): www.diabetes.org  American Association of Diabetes Care and Education Specialists (ADCES): www.diabeteseducator.org  International Diabetes Federation (IDF): MemberVerification.ca Summary  Caring for yourself after you have been diagnosed with type 2 diabetes (type 2 diabetes mellitus) means keeping your blood  sugar (glucose) under control with a balance of nutrition, exercise, lifestyle changes, and medicine.  Check your blood glucose every day, as often as told by your health care provider.  Having diabetes can put you at risk for other long-term (chronic) conditions, such as heart disease and kidney disease. Your health care provider may prescribe medicines to help prevent complications from diabetes.  Share your diabetes management plan with people in your workplace, school, and household.  Keep all follow-up visits as told by your health care provider. This is important. This information is not intended to replace advice given to you by your health care provider. Make sure you discuss any questions you have with your health care provider. Document Revised: 04/05/2019 Document Reviewed: 04/06/2019 Elsevier Patient Education  2021 [employer].   If you have lab work done today you will be contacted with your lab results within the next 2 weeks.  If you have not heard from [place]  then please contact us. The fastest way to get your results is to register for My Chart.   IF you received an x-ray today, you will receive an invoice from [facility]. Please contact [facility] at [phone] with questions or concerns regarding your invoice.   IF you received labwork today, you will receive an invoice from [facility]. Please contact [facility] at [phone] with questions or concerns regarding your invoice.   Our billing staff will not be able to assist you with questions regarding bills from these companies.  You will be contacted with the lab results as soon as they are available. The fastest way to get your results is to activate your My Chart account. Instructions are located on the last page of this paperwork. If you have not heard from [place] regarding the results in 2 weeks, please contact this office.         Signed, Merri Ray, MD [facility]

## 2020-05-11 NOTE — Patient Instructions (Addendum)
I do recommend pneumonia vaccine.  I will refer you to eye specialist.  Cholesterol rechecked, but recommend Lipitor initially once per week and increase to daily if tolerated. If any new side effects, can stop med and return to discuss. Keep up the good work with diet and exercise!  A1c is much better today.  You can continue Metformin 1 pill in the morning, twice in the evening.  If higher readings, can take second pill in the morning.  Recheck in 3 months.  Let me know if there are questions in the meantime    Type 2 Diabetes Mellitus, Self-Care, Adult Caring for yourself after you have been diagnosed with type 2 diabetes (type 2 diabetes mellitus) means keeping your blood sugar (glucose) under control with a balance of:  Nutrition.  Exercise.  Lifestyle changes.  Medicines or insulin, if needed.  Support from your team of health care providers and others. The following information explains what you need to know to manage your diabetes at home. What are the risks? Having diabetes can put you at risk for other long-term (chronic) conditions, such as heart disease and kidney disease. Your health care provider may prescribe medicines to help prevent complications from diabetes. How to monitor blood glucose  Check your blood glucose every day, as often as told by your health care provider.  Have your A1C (hemoglobin A1C) level checked two or more times a year, or as often as told by your health care provider.  Your health care provider will set personalized treatment goals for you. Generally, the goal of treatment is to maintain the following blood glucose levels: ? Before meals: 80-130 mg/dL (4.4-7.2 mmol/L). ? After meals: below 180 mg/dL (10 mmol/L). ? A1C level: less than 7%.   How to manage hyperglycemia and hypoglycemia Hyperglycemia symptoms Hyperglycemia, also called high blood glucose, occurs when blood glucose is too high. Make sure you know the early signs of  hyperglycemia, such as:  Increased thirst.  Hunger.  Feeling very tired.  Needing to urinate more often than usual.  Blurry vision. Hypoglycemia symptoms Hypoglycemia, also called low blood glucose, occurs with a blood glucose level at or below 70 mg/dL (3.9 mmol/L). Diabetes medicines lower your blood glucose and can cause hypoglycemia. The risk for hypoglycemia increases during or after exercise, during sleep, during illness, and when skipping meals or not eating for a long time (fasting). It is important to know the symptoms of hypoglycemia and treat it right away. Always have a 15-gram rapid-acting carbohydrate snack with you to treat low blood glucose. Family members and close friends should also know the symptoms and understand how to treat hypoglycemia, in case you are not able to treat yourself. Symptoms may include:  Hunger.  Anxiety.  Sweating and feeling clammy.  Dizziness or feeling light-headed.  Sleepiness.  Increased heart rate.  Irritability.  Tingling or numbness around the mouth, lips, or tongue.  Restless sleep. Severe hypoglycemia is when your blood glucose level is at or below 54 mg/dL (3 mmol/L). Severe hypoglycemia is an emergency. Do not wait to see if the symptoms will go away. Get medical help right away. Call your local emergency services (911 in the [place] Do not drive yourself to the hospital. If you have severe hypoglycemia and you cannot eat or drink, you may need glucagon. A family member or close friend should learn how to check your blood glucose and how to give you glucagon. Ask your health care provider if you need to have  an emergency glucagon kit available. Follow these instructions at home: Medicines  Take diabetes medicines as told by your health care provider. If your health care provider prescribed insulin or diabetes medicines, take them every day.  Do not run out of insulin or other diabetes medicines. Plan ahead so you always have  these available.  If you use insulin, adjust your dosage based on your physical activity and what foods you eat. Your health care provider will tell you how to adjust your dosage.  Take over-the-counter and prescription medicines only as told by your health care provider. Eating and drinking What you eat and drink affects your blood glucose and your insulin dosage. Making good choices helps to control your diabetes and prevent other health problems. A healthy meal plan includes eating lean proteins, complex carbohydrates, fresh fruits and vegetables, low-fat dairy products, and healthy fats. Make an appointment to see a registered dietitian to help you create an eating plan that is right for you. Make sure that you:  Follow instructions from your health care provider about eating or drinking restrictions.  Drink enough fluid to keep your urine pale yellow.  Keep a record of the carbohydrates that you eat. Do this by reading food labels and learning the standard serving sizes of foods.  Follow your sick-day plan whenever you cannot eat or drink as usual. Make this plan in advance with your health care provider.   Activity  Stay active. Exercise regularly, as told by your health care provider. This may include: ? Stretching and doing strength exercises, such as yoga or weight lifting, 2 or more times a week. ? Doing 150 minutes or more of moderate-intensity or vigorous-intensity exercise each week. This could be brisk walking, biking, or water aerobics.  Spread out your activity over 3 or more days of the week.  Do not go more than 2 days in a row without doing some kind of physical activity.  When you start a new exercise or activity, work with your health care provider to adjust your insulin, medicines, or food intake as needed. Lifestyle  Do not use any products that contain nicotine or tobacco, such as cigarettes, e-cigarettes, and chewing tobacco. If you need help quitting, ask your  health care provider.  If your health care provider says that alcohol is safe for you, limit how much you use to no more than 1 drink a day for women who are not pregnant and 2 drinks a day for men. In the [place] one drink equals one 12 oz bottle of beer (355 mL), one 5 oz glass of wine (148 mL), or one 1 oz glass of hard liquor (44 mL).  Learn to manage stress. If you need help with this, ask your health care provider. Take care of your body  Keep your immunizations up to date. In addition to getting vaccinations as told by your health care provider, it is recommended that you get vaccinated against the following illnesses: ? The flu (influenza). Get a flu shot every year. ? Pneumonia. ? Hepatitis B.  Schedule an eye exam soon after your diagnosis, and then one time every year after that.  Check your skin and feet every day for cuts, bruises, redness, blisters, or sores. Schedule a foot exam with your health care provider once every year.  Brush your teeth and gums two times a day, and floss one or more times a day. Visit your dentist one or more times every 6 months.  Maintain a healthy  weight.   General instructions  Share your diabetes management plan with people in your workplace, school, and household.  Carry a medical alert card or wear medical alert jewelry.  Keep all follow-up visits as told by your health care provider. This is important. Questions to ask your health care provider  Should I meet with a certified diabetes care and education specialist?  Where can I find a support group for people with diabetes? Where to find more information  American Diabetes Association (ADA): www.diabetes.org  American Association of Diabetes Care and Education Specialists (ADCES): www.diabeteseducator.org  International Diabetes Federation (IDF): MemberVerification.ca Summary  Caring for yourself after you have been diagnosed with type 2 diabetes (type 2 diabetes mellitus) means keeping  your blood sugar (glucose) under control with a balance of nutrition, exercise, lifestyle changes, and medicine.  Check your blood glucose every day, as often as told by your health care provider.  Having diabetes can put you at risk for other long-term (chronic) conditions, such as heart disease and kidney disease. Your health care provider may prescribe medicines to help prevent complications from diabetes.  Share your diabetes management plan with people in your workplace, school, and household.  Keep all follow-up visits as told by your health care provider. This is important. This information is not intended to replace advice given to you by your health care provider. Make sure you discuss any questions you have with your health care provider. Document Revised: 04/05/2019 Document Reviewed: 04/06/2019 Elsevier Patient Education  2021 [employer].   If you have lab work done today you will be contacted with your lab results within the next 2 weeks.  If you have not heard from [place] then please contact us. The fastest way to get your results is to register for My Chart.   IF you received an x-ray today, you will receive an invoice from [facility]. Please contact [facility] at [phone] with questions or concerns regarding your invoice.   IF you received labwork today, you will receive an invoice from [facility]. Please contact [facility] at [phone] with questions or concerns regarding your invoice.   Our billing staff will not be able to assist you with questions regarding bills from these companies.  You will be contacted with the lab results as soon as they are available. The fastest way to get your results is to activate your My Chart account. Instructions are located on the last page of this paperwork. If you have not heard from [place] regarding the results in 2 weeks, please contact this office.

## 2020-05-12 LAB — COMPREHENSIVE METABOLIC PANEL
ALT: 27 IU/L (ref 0–44)
AST: 22 IU/L (ref 0–40)
Albumin/Globulin Ratio: 1.8 (ref 1.2–2.2)
Albumin: 4.7 g/dL (ref 3.8–4.8)
Alkaline Phosphatase: 89 IU/L (ref 44–121)
BUN/Creatinine Ratio: 24 (ref 10–24)
BUN: 23 mg/dL (ref 8–27)
Bilirubin Total: 0.3 mg/dL (ref 0.0–1.2)
CO2: 21 mmol/L (ref 20–29)
Calcium: 9.6 mg/dL (ref 8.6–10.2)
Chloride: 105 mmol/L (ref 96–106)
Creatinine, Ser: 0.94 mg/dL (ref 0.76–1.27)
Globulin, Total: 2.6 g/dL (ref 1.5–4.5)
Glucose: 97 mg/dL (ref 65–99)
Potassium: 4.5 mmol/L (ref 3.5–5.2)
Sodium: 143 mmol/L (ref 134–144)
Total Protein: 7.3 g/dL (ref 6.0–8.5)
eGFR: 90 mL/min/{1.73_m2} (ref >59)

## 2020-05-12 LAB — LIPID PANEL
Chol/HDL Ratio: 4.2 ratio (ref 0.0–5.0)
Cholesterol, Total: 220 mg/dL — ABNORMAL HIGH (ref 100–199)
HDL: 53 mg/dL (ref >39)
LDL Chol Calc (NIH): 143 mg/dL — ABNORMAL HIGH (ref 0–99)
Triglycerides: 135 mg/dL (ref 0–149)
VLDL Cholesterol Cal: 24 mg/dL (ref 5–40)

## 2020-05-12 LAB — MICROALBUMIN / CREATININE URINE RATIO
Creatinine, Urine: 193.4 mg/dL
Microalb/Creat Ratio: <2 mg/g creat (ref 0–29)
Microalbumin, Urine: <3 ug/mL

## 2020-05-14 ENCOUNTER — Encounter: Payer: Self-pay | Admitting: Family Medicine

## 2020-08-14 ENCOUNTER — Ambulatory Visit: Payer: Self-pay | Admitting: Family Medicine

## 2020-09-04 ENCOUNTER — Ambulatory Visit (INDEPENDENT_AMBULATORY_CARE_PROVIDER_SITE_OTHER): Payer: Medicare Other | Admitting: Family Medicine

## 2020-09-04 ENCOUNTER — Other Ambulatory Visit: Payer: Self-pay

## 2020-09-04 ENCOUNTER — Encounter: Payer: Self-pay | Admitting: Family Medicine

## 2020-09-04 VITALS — BP 132/74 | HR 73 | Temp 98.0°F | Resp 16 | Ht 73.0 in | Wt 213.2 lb

## 2020-09-04 DIAGNOSIS — Z23 Encounter for immunization: Secondary | ICD-10-CM | POA: Diagnosis not present

## 2020-09-04 DIAGNOSIS — H81399 Other peripheral vertigo, unspecified ear: Secondary | ICD-10-CM | POA: Diagnosis not present

## 2020-09-04 DIAGNOSIS — E785 Hyperlipidemia, unspecified: Secondary | ICD-10-CM

## 2020-09-04 DIAGNOSIS — E119 Type 2 diabetes mellitus without complications: Secondary | ICD-10-CM

## 2020-09-04 MED ORDER — MECLIZINE HCL 25 MG PO TABS: 25.0000 mg | ORAL_TABLET | Freq: Three times a day (TID) | ORAL | 0 refills | Status: AC | PRN

## 2020-09-04 MED ORDER — ATORVASTATIN CALCIUM 10 MG PO TABS: 10.0000 mg | ORAL_TABLET | Freq: Every day | ORAL | 1 refills | Status: DC

## 2020-09-04 NOTE — Progress Notes (Signed)
Subjective:  Patient ID: Craig Nelson, male    DOB: February 14, 1956  Age: 64 y.o. MRN: [id number]  CC:  Chief Complaint  Patient presents with   Diabetes    Pt reports no concerns, report feels very good     HPI Craig Nelson presents for   Diabetes: With hyperglycemia, new diagnosis in November 2021.  Initially started on metformin.  He was taking metformin 1000 mg every afternoon with 500 mg dose of elevated readings.  He had significantly improved his diabetes with exercise and lifestyle changes, diet changes. with A1c of 6.2 in March.  Continued on metformin 1000 mg every afternoon, 500 mg every morning. Home readings: Fasting 116, 123 Postprandial: 106.  No symptomatic lows.  He is on statin with Lipitor QD - no new myalgias/side effects.  Still exercising - walking 6-7 miles.  10-12 hour workdays.  Microalbumin: normal ratio 05/11/20. Optho, foot exam, pneumovax:  Optho - referred last visit - has appt. in next month.  Pneumonia vaccine - recommended.  Wt Readings from Last 3 Encounters:  09/04/20 213 lb 3.2 oz (96.7 kg)  05/11/20 213 lb (96.6 kg)  02/17/20 225 lb (102.1 kg)    Lab Results  Component Value Date   HGBA1C 6.2 09/04/2020   HGBA1C 6.2 (A) 05/11/2020   HGBA1C 12.9 (A) 01/14/2020   Lab Results  Component Value Date   LDLCALC 143 (H) 05/11/2020   CREATININE 1.06 09/04/2020   Episodic vertigo Rare flare - every 1month or so.  Resolves with meclizine 1-2 times. And resolves.  No changes in frequency.  Room spinning.   HM: Declines covid vaccine. Agrees to pneumonia vaccine.  Recommended shingles vaccine at pharmacy.  Colonoscopy and cologuard discussed - declines both at this time.     History Patient Active Problem List   Diagnosis Date Noted   Obesity 04/06/2014   Erectile dysfunction 04/06/2014   Past Medical History:  Diagnosis Date   Allergy    History reviewed. No pertinent surgical history. No Known Allergies Prior to Admission  medications   Medication Sig Start Date End Date Taking? Authorizing Provider  acetaminophen (TYLENOL) 500 MG tablet Take 1 tablet (500 mg total) by mouth every 6 (six) hours as needed. 09/07/16  Yes WLeonie Douglas PA-C  aspirin EC 81 MG tablet Take 81 mg by mouth daily. Swallow whole.   Yes [provider]  atorvastatin (LIPITOR) 10 MG tablet Take 1 tablet (10 mg total) by mouth daily. Can start once per day. Increase as tolerated to QD dosing. 05/11/20  Yes GWendie Agreste MD  blood glucose meter kit and supplies Dispense based on patient and insurance preference. Use up to four times daily as directed. (FOR ICD-10 E10.9, E11.9). 01/14/20  Yes HPosey Boyer MD  Blood Glucose Monitoring Suppl (ACCU-CHEK AVIVA) device Use as instructed, Check before eating up to 3 times daily DX: E11.9 01/19/20 01/18/21 Yes GWendie Agreste MD  glucose blood (ACCU-CHEK AVIVA PLUS) test strip Use as instructed. Check before eating up to 3 times daily DX: E11.9 01/19/20  Yes GWendie Agreste MD  Lancets (ACCU-CHEK MULTICLIX) lancets Use as instructed, Check before eating up to 3 times daily DX: E11.9 01/19/20  Yes GWendie Agreste MD  metFORMIN (GLUCOPHAGE) 500 MG tablet Take 1 twice daily for 1 week, then increase to 2 twice daily at breakfast and supper. 01/14/20  Yes HPosey Boyer MD   Social History   Socioeconomic History   Marital status: Married  Spouse name: Not on file   Number of children: Not on file   Years of education: Not on file   Highest education level: Not on file  Occupational History   Not on file  Tobacco Use   Smoking status: Never   Smokeless tobacco: Never  Substance and Sexual Activity   Alcohol use: No   Drug use: No   Sexual activity: Yes  Other Topics Concern   Not on file  Social History Narrative   Not on file   Social Determinants of Health   Financial Resource Strain: Not on file  Food Insecurity: Not on file  Transportation Needs: Not on  file  Physical Activity: Not on file  Stress: Not on file  Social Connections: Not on file  Intimate Partner Violence: Not on file    Review of Systems  Constitutional:  Negative for fatigue and unexpected weight change.  Eyes:  Negative for visual disturbance.  Respiratory:  Negative for cough, chest tightness and shortness of breath.   Cardiovascular:  Negative for chest pain, palpitations and leg swelling.  Gastrointestinal:  Negative for abdominal pain and blood in stool.  Neurological:  Negative for dizziness, light-headedness and headaches.    Objective:   Vitals:   09/04/20 1335  BP: 132/74  Pulse: 73  Resp: 16  Temp: 98 F (36.7 C)  TempSrc: Temporal  SpO2: 98%  Weight: 213 lb 3.2 oz (96.7 kg)  Height: 6' 1"  (1.854 m)     Physical Exam Vitals reviewed.  Constitutional:      Appearance: He is well-developed.  HENT:     Head: Normocephalic and atraumatic.  Neck:     Vascular: No carotid bruit or JVD.  Cardiovascular:     Rate and Rhythm: Normal rate and regular rhythm.     Heart sounds: Normal heart sounds. No murmur heard. Pulmonary:     Effort: Pulmonary effort is normal.     Breath sounds: Normal breath sounds. No rales.  Musculoskeletal:     Right lower leg: No edema.     Left lower leg: No edema.  Skin:    General: Skin is warm and dry.  Neurological:     Mental Status: He is alert and oriented to person, place, and time.  Psychiatric:        Mood and Affect: Mood normal.       Assessment & Plan:  Craig Nelson is a 65 y.o. male . Type 2 diabetes mellitus without complication, without long-term current use of insulin ([place]) - Plan: Hemoglobin A1c  -  Stable, tolerating current regimen.  Again commended on diet/exercise changes.  Labs pending as above.  Continue metformin.  Need for pneumococcal vaccination - Plan: Pneumococcal conjugate vaccine 20-valent (Prevnar 20)  -Prevnar given  Episodic peripheral vertigo - Plan: meclizine (ANTIVERT)  25 MG tablet  -Rare/infrequent symptoms, stable symptoms thought to be peripheral vertigo., quick resolution with meclizine, refilled for intermittent use with RTC precautions if new or worsening symptoms  Hyperlipidemia, unspecified hyperlipidemia type - Plan: atorvastatin (LIPITOR) 10 MG tablet, Comprehensive metabolic panel, Lipid panel  -Continue statin, check labs.  Tolerating current dose.  Health maintenance discussed as above, declined colon cancer screening at this time as well as COVID vaccination  Meds ordered this encounter  Medications   meclizine (ANTIVERT) 25 MG tablet    Sig: Take 1 tablet (25 mg total) by mouth 3 (three) times daily as needed for dizziness.    Dispense:  30 tablet  Refill:  0   atorvastatin (LIPITOR) 10 MG tablet    Sig: Take 1 tablet (10 mg total) by mouth daily. Can start once per day. Increase as tolerated to QD dosing.    Dispense:  90 tablet    Refill:  1   Patient Instructions  Thanks for coming in today.  Keep up the good work with watching diet and exercise.  I will check your hemoglobin A1c as well as cholesterol levels today.  No change in medications for now.  If everything is stable, can follow-up in 6 months.  I do recommend shingles vaccine, that can be given at your pharmacy.  Pneumonia vaccine was given today.  I also recommend colon cancer screening either with colonoscopy or Cologuard.  Let me know if you would like to discuss those options further or if there are any questions.  Take care!    Signed,   Merri Ray, MD [place] 09/05/20 4:26 PM

## 2020-09-04 NOTE — Patient Instructions (Signed)
Thanks for coming in today.  Keep up the good work with watching diet and exercise.  I will check your hemoglobin A1c as well as cholesterol levels today.  No change in medications for now.  If everything is stable, can follow-up in 6 months.  I do recommend shingles vaccine, that can be given at your pharmacy.  Pneumonia vaccine was given today.  I also recommend colon cancer screening either with colonoscopy or Cologuard.  Let me know if you would like to discuss those options further or if there are any questions.  Take care!

## 2020-09-05 LAB — COMPREHENSIVE METABOLIC PANEL
ALT: 21 U/L (ref 0–53)
AST: 22 U/L (ref 0–37)
Albumin: 4.6 g/dL (ref 3.5–5.2)
Alkaline Phosphatase: 86 U/L (ref 39–117)
BUN: 26 mg/dL — ABNORMAL HIGH (ref 6–23)
CO2: 26 mEq/L (ref 19–32)
Calcium: 9.6 mg/dL (ref 8.4–10.5)
Chloride: 105 mEq/L (ref 96–112)
Creatinine, Ser: 1.06 mg/dL (ref 0.40–1.50)
GFR: 73.39 mL/min (ref >60.00)
Glucose, Bld: 94 mg/dL (ref 70–99)
Potassium: 4.3 mEq/L (ref 3.5–5.1)
Sodium: 140 mEq/L (ref 135–145)
Total Bilirubin: 0.5 mg/dL (ref 0.2–1.2)
Total Protein: 7.2 g/dL (ref 6.0–8.3)

## 2020-09-05 LAB — LIPID PANEL
Cholesterol: 157 mg/dL (ref 0–200)
HDL: 46.8 mg/dL (ref >39.00)
NonHDL: 109.86
Total CHOL/HDL Ratio: 3
Triglycerides: 237 mg/dL — ABNORMAL HIGH (ref 0.0–149.0)
VLDL: 47.4 mg/dL — ABNORMAL HIGH (ref 0.0–40.0)

## 2020-09-05 LAB — HEMOGLOBIN A1C: Hgb A1c MFr Bld: 6.2 % (ref 4.6–6.5)

## 2020-09-05 LAB — LDL CHOLESTEROL, DIRECT: Direct LDL: 85 mg/dL

## 2020-09-18 LAB — HM DIABETES EYE EXAM

## 2021-01-21 ENCOUNTER — Other Ambulatory Visit: Payer: Self-pay | Admitting: Family Medicine

## 2021-03-07 ENCOUNTER — Ambulatory Visit (INDEPENDENT_AMBULATORY_CARE_PROVIDER_SITE_OTHER): Payer: Medicare Other | Admitting: Family Medicine

## 2021-03-07 VITALS — BP 136/78 | HR 71 | Temp 98.4°F | Resp 17 | Ht 73.0 in | Wt 218.2 lb

## 2021-03-07 DIAGNOSIS — E785 Hyperlipidemia, unspecified: Secondary | ICD-10-CM | POA: Diagnosis not present

## 2021-03-07 DIAGNOSIS — E119 Type 2 diabetes mellitus without complications: Secondary | ICD-10-CM

## 2021-03-07 LAB — POCT GLYCOSYLATED HEMOGLOBIN (HGB A1C): Hemoglobin A1C: 5.8 % — AB (ref 4.0–5.6)

## 2021-03-07 MED ORDER — ATORVASTATIN CALCIUM 10 MG PO TABS
10.0000 mg | ORAL_TABLET | Freq: Every day | ORAL | 1 refills | Status: DC
Start: 2021-03-07 — End: 2021-09-21

## 2021-03-07 MED ORDER — METFORMIN HCL 500 MG PO TABS
ORAL_TABLET | ORAL | 1 refills | Status: DC
Start: 2021-03-07 — End: 2021-09-06

## 2021-03-07 NOTE — Progress Notes (Signed)
Subjective:  Patient ID: Craig Nelson, male    DOB: 06-Jun-1955  Age: 65 y.o. MRN: [id number]  CC:  Chief Complaint  Patient presents with   Hyperlipidemia    Pt reports out of his cholesterol meds for over 1 month   Diabetes    Pt reports no concerns feels well     HPI Craig Nelson presents for   Diabetes: New diagnosis diabetes in November 2021.  Impressive changes with diet/exercise and A1c control from 12.9-6.2 in March.  He is on statin.  Exercises daily walking 6 to 7 miles prior - too cold recently.  Wt Readings from Last 3 Encounters:  03/07/21 218 lb 3.2 oz (99 kg)  09/04/20 213 lb 3.2 oz (96.7 kg)  05/11/20 213 lb (96.6 kg)   Currently taking metformin 566m - 2 bid (10096mBID).  Home readings: 119, 107, 126. No symptomatic los.  Microalbumin: Normal ratio in March Optho, foot exam, pneumovax: Ophthalmology eval earlier this year was ok.   Lab Results  Component Value Date   HGBA1C 6.2 09/04/2020   HGBA1C 6.2 (A) 05/11/2020   HGBA1C 12.9 (A) 01/14/2020   Lab Results  Component Value Date   LDLCALC 143 (H) 05/11/2020   CREATININE 1.06 09/04/2020   Hyperlipidemia: Out of lipitor past month. No side effects on meds. Ran out. Rare arthralgias.no change with lipitor.  Lab Results  Component Value Date   CHOL 157 09/04/2020   HDL 46.80 09/04/2020   LDLCALC 143 (H) 05/11/2020   LDLDIRECT 85.0 09/04/2020   TRIG 237.0 (H) 09/04/2020   CHOLHDL 3 09/04/2020   Lab Results  Component Value Date   ALT 21 09/04/2020   AST 22 09/04/2020   ALKPHOS 86 09/04/2020   BILITOT 0.5 09/04/2020     History Patient Active Problem List   Diagnosis Date Noted   Obesity 04/06/2014   Erectile dysfunction 04/06/2014   Past Medical History:  Diagnosis Date   Allergy    No past surgical history on file. No Known Allergies Prior to Admission medications   Medication Sig Start Date End Date Taking? Authorizing Provider  ACCU-CHEK GUIDE test strip USE AS INSTRUCTED. CHECK  BEFORE EATING UP TO 3 TIMES DAILY DX: E11.9 01/22/21  Yes GrWendie AgresteMD  acetaminophen (TYLENOL) 500 MG tablet Take 1 tablet (500 mg total) by mouth every 6 (six) hours as needed. 09/07/16  Yes WiLeonie DouglasPA-C  aspirin EC 81 MG tablet Take 81 mg by mouth daily. Swallow whole.   Yes [provider]  atorvastatin (LIPITOR) 10 MG tablet Take 1 tablet (10 mg total) by mouth daily. Can start once per day. Increase as tolerated to QD dosing. 09/04/20  Yes GrWendie AgresteMD  blood glucose meter kit and supplies Dispense based on patient and insurance preference. Use up to four times daily as directed. (FOR ICD-10 E10.9, E11.9). 01/14/20  Yes HoPosey BoyerMD  Lancets (ACCU-CHEK MULTICLIX) lancets Use as instructed, Check before eating up to 3 times daily DX: E11.9 01/19/20  Yes GrWendie AgresteMD  meclizine (ANTIVERT) 25 MG tablet Take 1 tablet (25 mg total) by mouth 3 (three) times daily as needed for dizziness. 09/04/20  Yes GrWendie AgresteMD  metFORMIN (GLUCOPHAGE) 500 MG tablet Take 1 twice daily for 1 week, then increase to 2 twice daily at breakfast and supper. 01/14/20  Yes HoPosey BoyerMD   Social History   Socioeconomic History   Marital status: Married  Spouse name: Not on file   Number of children: Not on file   Years of education: Not on file   Highest education level: Not on file  Occupational History   Not on file  Tobacco Use   Smoking status: Never   Smokeless tobacco: Never  Substance and Sexual Activity   Alcohol use: No   Drug use: No   Sexual activity: Yes  Other Topics Concern   Not on file  Social History Narrative   Not on file   Social Determinants of Health   Financial Resource Strain: Not on file  Food Insecurity: Not on file  Transportation Needs: Not on file  Physical Activity: Not on file  Stress: Not on file  Social Connections: Not on file  Intimate Partner Violence: Not on file    Review of Systems   Constitutional:  Negative for fatigue and unexpected weight change.  Eyes:  Negative for visual disturbance.  Respiratory:  Negative for cough, chest tightness and shortness of breath.   Cardiovascular:  Negative for chest pain, palpitations and leg swelling.  Gastrointestinal:  Negative for abdominal pain and blood in stool.  Neurological:  Negative for dizziness, light-headedness and headaches.    Objective:   Vitals:   03/07/21 1029  BP: 136/78  Pulse: 71  Resp: 17  Temp: 98.4 F (36.9 C)  TempSrc: Temporal  SpO2: 96%  Weight: 218 lb 3.2 oz (99 kg)  Height: 6' 1"  (1.854 m)     Physical Exam Vitals reviewed.  Constitutional:      Appearance: He is well-developed.  HENT:     Head: Normocephalic and atraumatic.  Neck:     Vascular: No carotid bruit or JVD.  Cardiovascular:     Rate and Rhythm: Normal rate and regular rhythm.     Heart sounds: Normal heart sounds. No murmur heard. Pulmonary:     Effort: Pulmonary effort is normal.     Breath sounds: Normal breath sounds. No rales.  Musculoskeletal:     Right lower leg: No edema.     Left lower leg: No edema.  Skin:    General: Skin is warm and dry.  Neurological:     Mental Status: He is alert and oriented to person, place, and time.  Psychiatric:        Mood and Affect: Mood normal.   Results for orders placed or performed in visit on 03/07/21  POCT glycosylated hemoglobin (Hb A1C)  Result Value Ref Range   Hemoglobin A1C 5.8 (A) 4.0 - 5.6 %   HbA1c POC (<> result, manual entry)     HbA1c, POC (prediabetic range)     HbA1c, POC (controlled diabetic range)       Assessment & Plan:  Craig Nelson is a 65 y.o. male . Type 2 diabetes mellitus without complication, without long-term current use of insulin ([facility]) - Plan: Comprehensive metabolic panel, metFORMIN (GLUCOPHAGE) 500 MG tablet, POCT glycosylated hemoglobin (Hb A1C)  Hyperlipidemia, unspecified hyperlipidemia type - Plan: Comprehensive metabolic panel,  Lipid panel, atorvastatin (LIPITOR) 10 MG tablet  Diabetes well controlled - commended on diet and exercise changes. Can decrease metformin to 515m BID at this point - will call pt to discuss plan.   Meds ordered this encounter  Medications   atorvastatin (LIPITOR) 10 MG tablet    Sig: Take 1 tablet (10 mg total) by mouth daily. Can start once per day. Increase as tolerated to QD dosing.    Dispense:  90 tablet  Refill:  1   metFORMIN (GLUCOPHAGE) 500 MG tablet    Sig: 2 tabs BID    Dispense:  360 tablet    Refill:  1   Patient Instructions  Restart lipitor once per day, and have lab visit in 6 weeks. Keep up the good work with diet/exercise.  Occasional tylenol is fine but please follow up if persistent or worsening pains.  No change in metformin for now.     Signed,   Merri Ray, MD [place] 03/07/21 11:15 AM

## 2021-03-07 NOTE — Patient Instructions (Addendum)
Restart lipitor once per day, and have lab visit in 6 weeks. Keep up the good work with diet/exercise.  Occasional tylenol is fine but please follow up if persistent or worsening pains.  No change in metformin for now.

## 2021-03-10 ENCOUNTER — Telehealth: Payer: Self-pay | Admitting: Family Medicine

## 2021-03-10 NOTE — Telephone Encounter (Signed)
Please call patient.  I was unable to reach him last week.  Based on last A1c being very well controlled I think it is reasonable to decrease his metformin to 1 pill twice per day.  Recheck levels in 3 months.  Let me know if there are questions.

## 2021-03-13 NOTE — Telephone Encounter (Signed)
Called pt to discuss change in medication due to improved lab work. LM asking pt to return phone call to discuss

## 2021-03-14 NOTE — Telephone Encounter (Signed)
Called again, pt did answer, voiced understanding and will return in 3 months

## 2021-04-19 ENCOUNTER — Other Ambulatory Visit: Payer: Medicare Other

## 2021-09-06 ENCOUNTER — Other Ambulatory Visit: Payer: Self-pay | Admitting: Family Medicine

## 2021-09-06 DIAGNOSIS — E119 Type 2 diabetes mellitus without complications: Secondary | ICD-10-CM

## 2021-09-21 ENCOUNTER — Encounter: Payer: Self-pay | Admitting: Family Medicine

## 2021-09-21 ENCOUNTER — Ambulatory Visit (INDEPENDENT_AMBULATORY_CARE_PROVIDER_SITE_OTHER): Payer: Medicare Other | Admitting: Family Medicine

## 2021-09-21 VITALS — BP 128/74 | HR 67 | Temp 98.0°F | Resp 18

## 2021-09-21 DIAGNOSIS — E785 Hyperlipidemia, unspecified: Secondary | ICD-10-CM | POA: Diagnosis not present

## 2021-09-21 DIAGNOSIS — Z1159 Encounter for screening for other viral diseases: Secondary | ICD-10-CM

## 2021-09-21 DIAGNOSIS — E119 Type 2 diabetes mellitus without complications: Secondary | ICD-10-CM | POA: Diagnosis not present

## 2021-09-21 DIAGNOSIS — H81399 Other peripheral vertigo, unspecified ear: Secondary | ICD-10-CM | POA: Diagnosis not present

## 2021-09-21 DIAGNOSIS — Z1211 Encounter for screening for malignant neoplasm of colon: Secondary | ICD-10-CM

## 2021-09-21 DIAGNOSIS — Z125 Encounter for screening for malignant neoplasm of prostate: Secondary | ICD-10-CM

## 2021-09-21 DIAGNOSIS — Z0000 Encounter for general adult medical examination without abnormal findings: Secondary | ICD-10-CM

## 2021-09-21 LAB — LIPID PANEL
Cholesterol: 152 mg/dL (ref 0–200)
HDL: 43.2 mg/dL (ref >39.00)
LDL Cholesterol: 70 mg/dL (ref 0–99)
NonHDL: 108.39
Total CHOL/HDL Ratio: 4
Triglycerides: 191 mg/dL — ABNORMAL HIGH (ref 0.0–149.0)
VLDL: 38.2 mg/dL (ref 0.0–40.0)

## 2021-09-21 LAB — MICROALBUMIN / CREATININE URINE RATIO
Creatinine,U: 127 mg/dL
Microalb Creat Ratio: 0.6 mg/g (ref 0.0–30.0)
Microalb, Ur: <0.7 mg/dL (ref 0.0–1.9)

## 2021-09-21 LAB — COMPREHENSIVE METABOLIC PANEL
ALT: 19 U/L (ref 0–53)
AST: 18 U/L (ref 0–37)
Albumin: 4.5 g/dL (ref 3.5–5.2)
Alkaline Phosphatase: 70 U/L (ref 39–117)
BUN: 22 mg/dL (ref 6–23)
CO2: 28 mEq/L (ref 19–32)
Calcium: 9.4 mg/dL (ref 8.4–10.5)
Chloride: 103 mEq/L (ref 96–112)
Creatinine, Ser: 1.01 mg/dL (ref 0.40–1.50)
GFR: 77.2 mL/min (ref >60.00)
Glucose, Bld: 113 mg/dL — ABNORMAL HIGH (ref 70–99)
Potassium: 4.9 mEq/L (ref 3.5–5.1)
Sodium: 140 mEq/L (ref 135–145)
Total Bilirubin: 0.6 mg/dL (ref 0.2–1.2)
Total Protein: 7 g/dL (ref 6.0–8.3)

## 2021-09-21 LAB — PSA, MEDICARE: PSA: 1.71 ng/ml (ref 0.10–4.00)

## 2021-09-21 LAB — HEMOGLOBIN A1C: Hgb A1c MFr Bld: 6.3 % (ref 4.6–6.5)

## 2021-09-21 MED ORDER — ATORVASTATIN CALCIUM 10 MG PO TABS: 10.0000 mg | ORAL_TABLET | Freq: Every day | ORAL | 1 refills | Status: AC

## 2021-09-21 NOTE — Patient Instructions (Addendum)
I will order cologuard for colon cancer screening. It will be shipped to your house.  I recommend Shingrix shingles vaccine at your pharmacy.  You may be able to just take 1 metformin twice per day, but I will let you know when I see your labs.  Take care!  Preventive Care 16 Years and Older, Male Preventive care refers to lifestyle choices and visits with your health care provider that can promote health and wellness. Preventive care visits are also called wellness exams. What can I expect for my preventive care visit? Counseling During your preventive care visit, your health care provider may ask about your: Medical history, including: Past medical problems. Family medical history. History of falls. Current health, including: Emotional well-being. Home life and relationship well-being. Sexual activity. Memory and ability to understand (cognition). Lifestyle, including: Alcohol, nicotine or tobacco, and drug use. Access to firearms. Diet, exercise, and sleep habits. Work and work Astronomer. Sunscreen use. Safety issues such as seatbelt and bike helmet use. Physical exam Your health care provider will check your: Height and weight. These may be used to calculate your BMI (body mass index). BMI is a measurement that tells if you are at a healthy weight. Waist circumference. This measures the distance around your waistline. This measurement also tells if you are at a healthy weight and may help predict your risk of certain diseases, such as type 2 diabetes and high blood pressure. Heart rate and blood pressure. Body temperature. Skin for abnormal spots. What immunizations do I need?  Vaccines are usually given at various ages, according to a schedule. Your health care provider will recommend vaccines for you based on your age, medical history, and lifestyle or other factors, such as travel or where you work. What tests do I need? Screening Your health care provider may recommend  screening tests for certain conditions. This may include: Lipid and cholesterol levels. Diabetes screening. This is done by checking your blood sugar (glucose) after you have not eaten for a while (fasting). Hepatitis C test. Hepatitis B test. HIV (human immunodeficiency virus) test. STI (sexually transmitted infection) testing, if you are at risk. Lung cancer screening. Colorectal cancer screening. Prostate cancer screening. Abdominal aortic aneurysm (AAA) screening. You may need this if you are a current or former smoker. Talk with your health care provider about your test results, treatment options, and if necessary, the need for more tests. Follow these instructions at home: Eating and drinking  Eat a diet that includes fresh fruits and vegetables, whole grains, lean protein, and low-fat dairy products. Limit your intake of foods with high amounts of sugar, saturated fats, and salt. Take vitamin and mineral supplements as recommended by your health care provider. Do not drink alcohol if your health care provider tells you not to drink. If you drink alcohol: Limit how much you have to 0-2 drinks a day. Know how much alcohol is in your drink. In the [place] one drink equals one 12 oz bottle of beer (355 mL), one 5 oz glass of wine (148 mL), or one 1 oz glass of hard liquor (44 mL). Lifestyle Brush your teeth every morning and night with fluoride toothpaste. Floss one time each day. Exercise for at least 30 minutes 5 or more days each week. Do not use any products that contain nicotine or tobacco. These products include cigarettes, chewing tobacco, and vaping devices, such as e-cigarettes. If you need help quitting, ask your health care provider. Do not use drugs. If you are sexually  active, practice safe sex. Use a condom or other form of protection to prevent STIs. Take aspirin only as told by your health care provider. Make sure that you understand how much to take and what form to  take. Work with your health care provider to find out whether it is safe and beneficial for you to take aspirin daily. Ask your health care provider if you need to take a cholesterol-lowering medicine (statin). Find healthy ways to manage stress, such as: Meditation, yoga, or listening to music. Journaling. Talking to a trusted person. Spending time with friends and family. Safety Always wear your seat belt while driving or riding in a vehicle. Do not drive: If you have been drinking alcohol. Do not ride with someone who has been drinking. When you are tired or distracted. While texting. If you have been using any mind-altering substances or drugs. Wear a helmet and other protective equipment during sports activities. If you have firearms in your house, make sure you follow all gun safety procedures. Minimize exposure to UV radiation to reduce your risk of skin cancer. What's next? Visit your health care provider once a year for an annual wellness visit. Ask your health care provider how often you should have your eyes and teeth checked. Stay up to date on all vaccines. This information is not intended to replace advice given to you by your health care provider. Make sure you discuss any questions you have with your health care provider. Document Revised: 08/23/2020 Document Reviewed: 08/23/2020 Elsevier Patient Education  2023 [employer].

## 2021-09-21 NOTE — Progress Notes (Signed)
Subjective:  Patient ID: Craig Nelson, male    DOB: 1955-09-26  Age: 66 y.o. MRN: [id number]  CC:  Chief Complaint  Patient presents with   Annual Exam    Pt is fasting     HPI [name] presents for Annual Exam  Care team: PCP, me   Diabetes: New diagnosis November 2021, but significant changes in diet and exercise to improve his A1c from 12.9-6.2 last year.  Still improving control in December, option to decrease metformin to 500 mg twice daily.  He is on statin, regular exercise. Still taking taking 2 metformin 55m bid. No side effects.  Home readings 116 fasting today. 116-120. Rare postprandial - 98.  No lows.  Microalbumin: Normal ratio March 2022, repeat today Optho, foot exam, pneumovax: Up-to-date  Lab Results  Component Value Date   HGBA1C 5.8 (A) 03/07/2021   HGBA1C 6.2 09/04/2020   HGBA1C 6.2 (A) 05/11/2020   Lab Results  Component Value Date   LDLCALC 143 (H) 05/11/2020   CREATININE 1.06 09/04/2020   Wt Readings from Last 3 Encounters:  03/07/21 218 lb 3.2 oz (99 kg)  09/04/20 213 lb 3.2 oz (96.7 kg)  05/11/20 213 lb (96.6 kg)   Hyperlipidemia: Lipitor 10 mg daily, no new myalgias.  Lab Results  Component Value Date   CHOL 157 09/04/2020   HDL 46.80 09/04/2020   LDLCALC 143 (H) 05/11/2020   LDLDIRECT 85.0 09/04/2020   TRIG 237.0 (H) 09/04/2020   CHOLHDL 3 09/04/2020   Lab Results  Component Value Date   ALT 21 09/04/2020   AST 22 09/04/2020   ALKPHOS 86 09/04/2020   BILITOT 0.5 09/04/2020   Rare vertigo, treats with meclizine. No rf needed.       09/21/2021    8:09 AM 09/04/2020    1:39 PM 05/11/2020    2:51 PM 02/17/2020    2:55 PM 01/14/2020    8:07 AM  Depression screen PHQ 2/9  Decreased Interest 0 0 0 0 0  Down, Depressed, Hopeless 0 0 0 0 0  PHQ - 2 Score 0 0 0 0 0  Altered sleeping  0     Tired, decreased energy  0     Change in appetite  0     Feeling bad or failure about yourself   0     Trouble concentrating  0      Moving slowly or fidgety/restless  0     Suicidal thoughts  0     PHQ-9 Score  0       Health Maintenance  Topic Date Due   Hepatitis C Screening  Never done   URINE MICROALBUMIN  05/11/2021   COVID-19 Vaccine (1) 10/07/2021 (Originally 03/06/1956)   Zoster Vaccines- Shingrix (1 of 2) 12/22/2021 (Originally 09/03/2004)   COLONOSCOPY (Pts 45-457yrInsurance coverage will need to be confirmed)  09/22/2022 (Originally 09/04/1999)   INFLUENZA VACCINE  10/09/2021   TETANUS/TDAP  04/06/2024   Pneumonia Vaccine 6551Years old  Completed   HPV VACCINES  Aged Out  Colon CA screening: Screening options with colonoscopy versus Cologuard discussed. Discussed timing of repeat testing intervals if normal, as well as potential need for diagnostic Colonoscopy if positive Cologuard. Understanding expressed, and chose Cologuard. No FH of colon CA. Prostate: does have family history of prostate cancer The natural history of prostate cancer and ongoing controversy regarding screening and potential treatment outcomes of prostate cancer has been discussed with the patient. The meaning of a false positive  PSA and a false negative PSA has been discussed. He indicates understanding of the limitations of this screening test and wishes to proceed with screening PSA testing. Lab Results  Component Value Date   PSA 1.70 04/06/2014  No hx of skin CA, no new skin lesions.   Immunization History  Administered Date(s) Administered   PNEUMOCOCCAL CONJUGATE-20 09/04/2020   Tdap 04/06/2014  COVID-19 vaccine: declined.  Shingles vaccine: recommended at pharmacy.   No results found. Optho last year.   Dental: saw dentist 9 months ago.   Alcohol:none  Tobacco: none  Exercise: walking 3 miles per day   History Patient Active Problem List   Diagnosis Date Noted   Obesity 04/06/2014   Erectile dysfunction 04/06/2014   Past Medical History:  Diagnosis Date   Allergy    History reviewed. No pertinent  surgical history. No Known Allergies Prior to Admission medications   Medication Sig Start Date End Date Taking? Authorizing Provider  ACCU-CHEK GUIDE test strip USE AS INSTRUCTED. CHECK BEFORE EATING UP TO 3 TIMES DAILY DX: E11.9 01/22/21   Wendie Agreste, MD  acetaminophen (TYLENOL) 500 MG tablet Take 1 tablet (500 mg total) by mouth every 6 (six) hours as needed. 09/07/16   Leonie Douglas, PA-C  aspirin EC 81 MG tablet Take 81 mg by mouth daily. Swallow whole.    [provider]  atorvastatin (LIPITOR) 10 MG tablet Take 1 tablet (10 mg total) by mouth daily. Can start once per day. Increase as tolerated to QD dosing. 03/07/21   Wendie Agreste, MD  blood glucose meter kit and supplies Dispense based on patient and insurance preference. Use up to four times daily as directed. (FOR ICD-10 E10.9, E11.9). 01/14/20   Posey Boyer, MD  Lancets (ACCU-CHEK MULTICLIX) lancets Use as instructed, Check before eating up to 3 times daily DX: E11.9 01/19/20   Wendie Agreste, MD  meclizine (ANTIVERT) 25 MG tablet Take 1 tablet (25 mg total) by mouth 3 (three) times daily as needed for dizziness. 09/04/20   Wendie Agreste, MD  metFORMIN (GLUCOPHAGE) 500 MG tablet 2 TABLETS TWICE A DAY 09/06/21   Wendie Agreste, MD   Social History   Socioeconomic History   Marital status: Married    Spouse name: Not on file   Number of children: Not on file   Years of education: Not on file   Highest education level: Not on file  Occupational History   Not on file  Tobacco Use   Smoking status: Never   Smokeless tobacco: Never  Substance and Sexual Activity   Alcohol use: No   Drug use: No   Sexual activity: Yes  Other Topics Concern   Not on file  Social History Narrative   Not on file   Social Determinants of Health   Financial Resource Strain: Not on file  Food Insecurity: Not on file  Transportation Needs: Not on file  Physical Activity: Not on file  Stress: Not on file   Social Connections: Not on file  Intimate Partner Violence: Not on file    Review of Systems 13 point review of systems per patient health survey noted.  Negative other than as indicated above or in HPI.    Objective:   Vitals:   09/21/21 0806  BP: 128/74  Pulse: 67  Resp: 18  Temp: 98 F (36.7 C)  TempSrc: Oral  SpO2: 100%     Physical Exam Vitals reviewed.  Constitutional:  Appearance: He is well-developed.  HENT:     Head: Normocephalic and atraumatic.     Right Ear: External ear normal.     Left Ear: External ear normal.  Eyes:     Conjunctiva/sclera: Conjunctivae normal.     Pupils: Pupils are equal, round, and reactive to light.  Neck:     Thyroid: No thyromegaly.  Cardiovascular:     Rate and Rhythm: Normal rate and regular rhythm.     Heart sounds: Normal heart sounds.  Pulmonary:     Effort: Pulmonary effort is normal. No respiratory distress.     Breath sounds: Normal breath sounds. No wheezing.  Abdominal:     General: There is no distension.     Palpations: Abdomen is soft.     Tenderness: There is no abdominal tenderness.  Musculoskeletal:        General: No tenderness. Normal range of motion.     Cervical back: Normal range of motion and neck supple.  Lymphadenopathy:     Cervical: No cervical adenopathy.  Skin:    General: Skin is warm and dry.  Neurological:     Mental Status: He is alert and oriented to person, place, and time.     Deep Tendon Reflexes: Reflexes are normal and symmetric.  Psychiatric:        Behavior: Behavior normal.        Assessment & Plan:  Rafeal Skibicki is a 66 y.o. male . Annual physical exam - Plan: Comprehensive metabolic panel, Lipid panel, Hemoglobin A1c, PSA, Medicare, Hepatitis C antibody, Microalbumin / creatinine urine ratio  - -anticipatory guidance as below in AVS, screening labs above. Health maintenance items as above in HPI discussed/recommended as applicable.   Episodic peripheral  vertigo  -Stable with rare use meclizine, continue same with RTC precautions  Hyperlipidemia, unspecified hyperlipidemia type - Plan: Lipid panel, atorvastatin (LIPITOR) 10 MG tablet  -Tolerating Lipitor, check labs, continue same.  Type 2 diabetes mellitus without complication, without long-term current use of insulin (HCC) - Plan: Hemoglobin A1c, Microalbumin / creatinine urine ratio  -Updated labs ordered, likely will be able to decrease dosing to 500 mg twice daily metformin but will wait on lab results first.  Continue to watch diet, exercise.  Commended on his efforts and prior improvement in glycemic control  Need for hepatitis C screening test - Plan: Hepatitis C antibody  Screen for colon cancer - Plan: Cologuard   Meds ordered this encounter  Medications   atorvastatin (LIPITOR) 10 MG tablet    Sig: Take 1 tablet (10 mg total) by mouth daily.    Dispense:  90 tablet    Refill:  1   Patient Instructions  I will order cologuard for colon cancer screening. It will be shipped to your house.  I recommend Shingrix shingles vaccine at your pharmacy.  You may be able to just take 1 metformin twice per day, but I will let you know when I see your labs.  Take care!  Preventive Care 90 Years and Older, Male Preventive care refers to lifestyle choices and visits with your health care provider that can promote health and wellness. Preventive care visits are also called wellness exams. What can I expect for my preventive care visit? Counseling During your preventive care visit, your health care provider may ask about your: Medical history, including: Past medical problems. Family medical history. History of falls. Current health, including: Emotional well-being. Home life and relationship well-being. Sexual activity. Memory and ability to understand (  cognition). Lifestyle, including: Alcohol, nicotine or tobacco, and drug use. Access to firearms. Diet, exercise, and sleep  habits. Work and work Statistician. Sunscreen use. Safety issues such as seatbelt and bike helmet use. Physical exam Your health care provider will check your: Height and weight. These may be used to calculate your BMI (body mass index). BMI is a measurement that tells if you are at a healthy weight. Waist circumference. This measures the distance around your waistline. This measurement also tells if you are at a healthy weight and may help predict your risk of certain diseases, such as type 2 diabetes and high blood pressure. Heart rate and blood pressure. Body temperature. Skin for abnormal spots. What immunizations do I need?  Vaccines are usually given at various ages, according to a schedule. Your health care provider will recommend vaccines for you based on your age, medical history, and lifestyle or other factors, such as travel or where you work. What tests do I need? Screening Your health care provider may recommend screening tests for certain conditions. This may include: Lipid and cholesterol levels. Diabetes screening. This is done by checking your blood sugar (glucose) after you have not eaten for a while (fasting). Hepatitis C test. Hepatitis B test. HIV (human immunodeficiency virus) test. STI (sexually transmitted infection) testing, if you are at risk. Lung cancer screening. Colorectal cancer screening. Prostate cancer screening. Abdominal aortic aneurysm (AAA) screening. You may need this if you are a current or former smoker. Talk with your health care provider about your test results, treatment options, and if necessary, the need for more tests. Follow these instructions at home: Eating and drinking  Eat a diet that includes fresh fruits and vegetables, whole grains, lean protein, and low-fat dairy products. Limit your intake of foods with high amounts of sugar, saturated fats, and salt. Take vitamin and mineral supplements as recommended by your health care  provider. Do not drink alcohol if your health care provider tells you not to drink. If you drink alcohol: Limit how much you have to 0-2 drinks a day. Know how much alcohol is in your drink. In the [place] one drink equals one 12 oz bottle of beer (355 mL), one 5 oz glass of wine (148 mL), or one 1 oz glass of hard liquor (44 mL). Lifestyle Brush your teeth every morning and night with fluoride toothpaste. Floss one time each day. Exercise for at least 30 minutes 5 or more days each week. Do not use any products that contain nicotine or tobacco. These products include cigarettes, chewing tobacco, and vaping devices, such as e-cigarettes. If you need help quitting, ask your health care provider. Do not use drugs. If you are sexually active, practice safe sex. Use a condom or other form of protection to prevent STIs. Take aspirin only as told by your health care provider. Make sure that you understand how much to take and what form to take. Work with your health care provider to find out whether it is safe and beneficial for you to take aspirin daily. Ask your health care provider if you need to take a cholesterol-lowering medicine (statin). Find healthy ways to manage stress, such as: Meditation, yoga, or listening to music. Journaling. Talking to a trusted person. Spending time with friends and family. Safety Always wear your seat belt while driving or riding in a vehicle. Do not drive: If you have been drinking alcohol. Do not ride with someone who has been drinking. When you are tired or distracted.  While texting. If you have been using any mind-altering substances or drugs. Wear a helmet and other protective equipment during sports activities. If you have firearms in your house, make sure you follow all gun safety procedures. Minimize exposure to UV radiation to reduce your risk of skin cancer. What's next? Visit your health care provider once a year for an annual wellness visit. Ask  your health care provider how often you should have your eyes and teeth checked. Stay up to date on all vaccines. This information is not intended to replace advice given to you by your health care provider. Make sure you discuss any questions you have with your health care provider. Document Revised: 08/23/2020 Document Reviewed: 08/23/2020 Elsevier Patient Education  [place],   Merri Ray, MD [place] 09/21/21 8:35 AM

## 2021-09-24 LAB — HEPATITIS C ANTIBODY: Hepatitis C Ab: NONREACTIVE

## 2021-10-07 LAB — COLOGUARD: COLOGUARD: NEGATIVE

## 2021-10-09 NOTE — Progress Notes (Signed)
Spoke with pt and advised of lab results  

## 2021-10-25 ENCOUNTER — Ambulatory Visit (INDEPENDENT_AMBULATORY_CARE_PROVIDER_SITE_OTHER): Payer: Medicare Other

## 2021-10-25 DIAGNOSIS — Z0000 Encounter for general adult medical examination without abnormal findings: Secondary | ICD-10-CM

## 2021-10-25 NOTE — Patient Instructions (Signed)
Mr. Craig Nelson , Thank you for taking time to come for your Medicare Wellness Visit. I appreciate your ongoing commitment to your health goals. Please review the following plan we discussed and let me know if I can assist you in the future.   Screening recommendations/referrals: Colonoscopy: Cologuard 09/30/21 Recommended yearly ophthalmology/optometry visit for glaucoma screening and checkup Recommended yearly dental visit for hygiene and checkup  Vaccinations: Influenza vaccine: completed  Pneumococcal vaccine: completed  Tdap vaccine: 10/05/2014 Shingles vaccine: will consider     Advanced directives: none   Conditions/risks identified: none   Next appointment: none   Preventive Care 65 Years and Older, Male Preventive care refers to lifestyle choices and visits with your health care provider that can promote health and wellness. What does preventive care include? A yearly physical exam. This is also called an annual well check. Dental exams once or twice a year. Routine eye exams. Ask your health care provider how often you should have your eyes checked. Personal lifestyle choices, including: Daily care of your teeth and gums. Regular physical activity. Eating a healthy diet. Avoiding tobacco and drug use. Limiting alcohol use. Practicing safe sex. Taking low doses of aspirin every day. Taking vitamin and mineral supplements as recommended by your health care provider. What happens during an annual well check? The services and screenings done by your health care provider during your annual well check will depend on your age, overall health, lifestyle risk factors, and family history of disease. Counseling  Your health care provider may ask you questions about your: Alcohol use. Tobacco use. Drug use. Emotional well-being. Home and relationship well-being. Sexual activity. Eating habits. History of falls. Memory and ability to understand (cognition). Work and work  Astronomer. Screening  You may have the following tests or measurements: Height, weight, and BMI. Blood pressure. Lipid and cholesterol levels. These may be checked every 5 years, or more frequently if you are over 47 years old. Skin check. Lung cancer screening. You may have this screening every year starting at age 57 if you have a 30-pack-year history of smoking and currently smoke or have quit within the past 15 years. Fecal occult blood test (FOBT) of the stool. You may have this test every year starting at age 13. Flexible sigmoidoscopy or colonoscopy. You may have a sigmoidoscopy every 5 years or a colonoscopy every 10 years starting at age 64. Prostate cancer screening. Recommendations will vary depending on your family history and other risks. Hepatitis C blood test. Hepatitis B blood test. Sexually transmitted disease (STD) testing. Diabetes screening. This is done by checking your blood sugar (glucose) after you have not eaten for a while (fasting). You may have this done every 1-3 years. Abdominal aortic aneurysm (AAA) screening. You may need this if you are a current or former smoker. Osteoporosis. You may be screened starting at age 1 if you are at high risk. Talk with your health care provider about your test results, treatment options, and if necessary, the need for more tests. Vaccines  Your health care provider may recommend certain vaccines, such as: Influenza vaccine. This is recommended every year. Tetanus, diphtheria, and acellular pertussis (Tdap, Td) vaccine. You may need a Td booster every 10 years. Zoster vaccine. You may need this after age 76. Pneumococcal 13-valent conjugate (PCV13) vaccine. One dose is recommended after age 34. Pneumococcal polysaccharide (PPSV23) vaccine. One dose is recommended after age 16. Talk to your health care provider about which screenings and vaccines you need and how often  you need them. This information is not intended to replace  advice given to you by your health care provider. Make sure you discuss any questions you have with your health care provider. Document Released: 03/24/2015 Document Revised: 11/15/2015 Document Reviewed: 12/27/2014 Elsevier Interactive Patient Education  2017 [place] Prevention in the Home Falls can cause injuries. They can happen to people of all ages. There are many things you can do to make your home safe and to help prevent falls. What can I do on the outside of my home? Regularly fix the edges of walkways and driveways and fix any cracks. Remove anything that might make you trip as you walk through a door, such as a raised step or threshold. Trim any bushes or trees on the path to your home. Use bright outdoor lighting. Clear any walking paths of anything that might make someone trip, such as rocks or tools. Regularly check to see if handrails are loose or broken. Make sure that both sides of any steps have handrails. Any raised decks and porches should have guardrails on the edges. Have any leaves, snow, or ice cleared regularly. Use sand or salt on walking paths during winter. Clean up any spills in your garage right away. This includes oil or grease spills. What can I do in the bathroom? Use night lights. Install grab bars by the toilet and in the tub and shower. Do not use towel bars as grab bars. Use non-skid mats or decals in the tub or shower. If you need to sit down in the shower, use a plastic, non-slip stool. Keep the floor dry. Clean up any water that spills on the floor as soon as it happens. Remove soap buildup in the tub or shower regularly. Attach bath mats securely with double-sided non-slip rug tape. Do not have throw rugs and other things on the floor that can make you trip. What can I do in the bedroom? Use night lights. Make sure that you have a light by your bed that is easy to reach. Do not use any sheets or blankets that are too big for your bed.  They should not hang down onto the floor. Have a firm chair that has side arms. You can use this for support while you get dressed. Do not have throw rugs and other things on the floor that can make you trip. What can I do in the kitchen? Clean up any spills right away. Avoid walking on wet floors. Keep items that you use a lot in easy-to-reach places. If you need to reach something above you, use a strong step stool that has a grab bar. Keep electrical cords out of the way. Do not use floor polish or wax that makes floors slippery. If you must use wax, use non-skid floor wax. Do not have throw rugs and other things on the floor that can make you trip. What can I do with my stairs? Do not leave any items on the stairs. Make sure that there are handrails on both sides of the stairs and use them. Fix handrails that are broken or loose. Make sure that handrails are as long as the stairways. Check any carpeting to make sure that it is firmly attached to the stairs. Fix any carpet that is loose or worn. Avoid having throw rugs at the top or bottom of the stairs. If you do have throw rugs, attach them to the floor with carpet tape. Make sure that you have a light  switch at the top of the stairs and the bottom of the stairs. If you do not have them, ask someone to add them for you. What else can I do to help prevent falls? Wear shoes that: Do not have high heels. Have rubber bottoms. Are comfortable and fit you well. Are closed at the toe. Do not wear sandals. If you use a stepladder: Make sure that it is fully opened. Do not climb a closed stepladder. Make sure that both sides of the stepladder are locked into place. Ask someone to hold it for you, if possible. Clearly mark and make sure that you can see: Any grab bars or handrails. First and last steps. Where the edge of each step is. Use tools that help you move around (mobility aids) if they are needed. These  include: Canes. Walkers. Scooters. Crutches. Turn on the lights when you go into a dark area. Replace any light bulbs as soon as they burn out. Set up your furniture so you have a clear path. Avoid moving your furniture around. If any of your floors are uneven, fix them. If there are any pets around you, be aware of where they are. Review your medicines with your doctor. Some medicines can make you feel dizzy. This can increase your chance of falling. Ask your doctor what other things that you can do to help prevent falls. This information is not intended to replace advice given to you by your health care provider. Make sure you discuss any questions you have with your health care provider. Document Released: 12/22/2008 Document Revised: 08/03/2015 Document Reviewed: 04/01/2014 Elsevier Interactive Patient Education  2017 [employer].

## 2021-10-25 NOTE — Progress Notes (Signed)
Subjective:   Craig Nelson is a 66 y.o. male who presents for an Initial Medicare Annual Wellness Visit.   I connected with Renetta Chalk  today by telephone and verified that I am speaking with the correct person using two identifiers. Location patient: home Location provider: work Persons participating in the virtual visit: patient, provider.   I discussed the limitations, risks, security and privacy concerns of performing an evaluation and management service by telephone and the availability of in person appointments. I also discussed with the patient that there may be a patient responsible charge related to this service. The patient expressed understanding and verbally consented to this telephonic visit.    Interactive audio and video telecommunications were attempted between this provider and patient, however failed, due to patient having technical difficulties OR patient did not have access to video capability.  We continued and completed visit with audio only.    Review of Systems     Cardiac Risk Factors include: advanced age (>31mn, >>61women);male gender;diabetes mellitus     Objective:    Today's Vitals   There is no height or weight on file to calculate BMI.     10/25/2021    4:07 PM  Advanced Directives  Does Patient Have a Medical Advance Directive? Yes  [place] of [place]  Copy of [place]? No - copy requested    Current Medications (verified) Outpatient Encounter Medications as of 10/25/2021  Medication Sig   ACCU-CHEK GUIDE test strip USE AS INSTRUCTED. CHECK BEFORE EATING UP TO 3 TIMES DAILY DX: E11.9   acetaminophen (TYLENOL) 500 MG tablet Take 1 tablet (500 mg total) by mouth every 6 (six) hours as needed.   atorvastatin (LIPITOR) 10 MG tablet Take 1 tablet (10 mg total) by mouth daily.   blood glucose meter kit and supplies Dispense based on patient and insurance preference. Use  up to four times daily as directed. (FOR ICD-10 E10.9, E11.9).   Lancets (ACCU-CHEK MULTICLIX) lancets Use as instructed, Check before eating up to 3 times daily DX: E11.9   meclizine (ANTIVERT) 25 MG tablet Take 1 tablet (25 mg total) by mouth 3 (three) times daily as needed for dizziness.   metFORMIN (GLUCOPHAGE) 500 MG tablet 2 TABLETS TWICE A DAY   No facility-administered encounter medications on file as of 10/25/2021.    Allergies (verified) Patient has no known allergies.   History: Past Medical History:  Diagnosis Date   Allergy    History reviewed. No pertinent surgical history. Family History  Problem Relation Age of Onset   Diabetes Mother    Heart disease Mother    Hyperlipidemia Mother    Social History   Socioeconomic History   Marital status: Married    Spouse name: Not on file   Number of children: Not on file   Years of education: Not on file   Highest education level: Not on file  Occupational History   Not on file  Tobacco Use   Smoking status: Never   Smokeless tobacco: Never  Substance and Sexual Activity   Alcohol use: No   Drug use: No   Sexual activity: Yes  Other Topics Concern   Not on file  Social History Narrative   Not on file   Social Determinants of Health   Financial Resource Strain: Low Risk  (10/25/2021)   Overall Financial Resource Strain (CARDIA)    Difficulty of Paying Living Expenses: Not hard at  all  Food Insecurity: No Food Insecurity (10/25/2021)   Hunger Vital Sign    Worried About Running Out of Food in the Last Year: Never true    Ran Out of Food in the Last Year: Never true  Transportation Needs: No Transportation Needs (10/25/2021)   PRAPARE - Hydrologist (Medical): No    Lack of Transportation (Non-Medical): No  Physical Activity: Sufficiently Active (10/25/2021)   Exercise Vital Sign    Days of Exercise per Week: 3 days    Minutes of Exercise per Session: 60 min  Stress: No Stress  Concern Present (10/25/2021)   [place]    Feeling of Stress : Not at all  Social Connections: Moderately Integrated (10/25/2021)   Social Connection and Isolation Panel [NHANES]    Frequency of Communication with Friends and Family: Three times a week    Frequency of Social Gatherings with Friends and Family: Three times a week    Attends Religious Services: More than 4 times per year    Active Member of Clubs or Organizations: No    Attends Archivist Meetings: Never    Marital Status: Married    Tobacco Counseling Counseling given: Not Answered   Clinical Intake:  Pre-visit preparation completed: Yes  Pain : No/denies pain     Nutritional Risks: None Diabetes: Yes CBG done?: No Did pt. bring in CBG monitor from home?: No  How often do you need to have someone help you when you read instructions, pamphlets, or other written materials from your doctor or pharmacy?: 1 - Never What is the last grade level you completed in school?: High School  Diabetic?yes Nutrition Risk Assessment:  Has the patient had any N/V/D within the last 2 months?  No  Does the patient have any non-healing wounds?  No  Has the patient had any unintentional weight loss or weight gain?  No   Diabetes:  Is the patient diabetic?  Yes  If diabetic, was a CBG obtained today?  No  Did the patient bring in their glucometer from home?  No  How often do you monitor your CBG's? 1-2 times a day .   Financial Strains and Diabetes Management:  Are you having any financial strains with the device, your supplies or your medication? No .  Does the patient want to be seen by Chronic Care Management for management of their diabetes?  No  Would the patient like to be referred to a Nutritionist or for Diabetic Management?  No   Diabetic Exams:  Diabetic Eye Exam: Completed 01/2021 Diabetic Foot Exam: Overdue, Pt has been advised  about the importance in completing this exam. Pt is scheduled for diabetic foot exam on next office visit .   Interpreter Needed?: No  Information entered by :: L.Wilson,LPN   Activities of Daily Living    10/25/2021    4:07 PM  In your present state of health, do you have any difficulty performing the following activities:  Hearing? 0  Vision? 0  Difficulty concentrating or making decisions? 0  Walking or climbing stairs? 0  Dressing or bathing? 0  Doing errands, shopping? 0  Preparing Food and eating ? N  Using the Toilet? N  In the past six months, have you accidently leaked urine? N  Do you have problems with loss of bowel control? N  Managing your Medications? N  Managing your Finances? N  Housekeeping or managing your  Housekeeping? N    Patient Care Team: Wendie Agreste, MD as PCP - General (Family Medicine)  Indicate any recent Medical Services you may have received from other than [facility] providers in the past year (date may be approximate).     Assessment:   This is a routine wellness examination for [place].  Hearing/Vision screen Vision Screening - Comments:: Annual eye exams   Dietary issues and exercise activities discussed: Current Exercise Habits: Home exercise routine, Type of exercise: walking, Time (Minutes): 60, Frequency (Times/Week): 3, Weekly Exercise (Minutes/Week): 180, Intensity: Mild, Exercise limited by: None identified   Goals Addressed   None    Depression Screen    10/25/2021    4:07 PM 10/25/2021    4:06 PM 09/21/2021    8:09 AM 09/04/2020    1:39 PM 05/11/2020    2:51 PM 02/17/2020    2:55 PM 01/14/2020    8:07 AM  PHQ 2/9 Scores  PHQ - 2 Score 0 0 0 0 0 0 0  PHQ- 9 Score    0       Fall Risk    10/25/2021    4:07 PM 09/21/2021    8:08 AM 09/04/2020    1:39 PM 05/11/2020    2:50 PM 02/17/2020    2:54 PM  [place] in the past year? 0 0 0 0 0  Number falls in past yr: 0 0   0  Injury with Fall? 0 0   0  Risk for fall due  to :  No Fall Risks   No Fall Risks  Follow up Falls evaluation completed;Education provided Falls evaluation completed Falls evaluation completed Falls evaluation completed Falls evaluation completed    [place]:  Any stairs in or around the home? Yes  If so, are there any without handrails? No  Home free of loose throw rugs in walkways, pet beds, electrical cords, etc? Yes  Adequate lighting in your home to reduce risk of falls? Yes   ASSISTIVE DEVICES UTILIZED TO PREVENT FALLS:  Life alert? No  Use of a cane, walker or w/c? No  Grab bars in the bathroom? No  Shower chair or bench in shower? No  Elevated toilet seat or a handicapped toilet? No     Cognitive Function:    Normal cognitive status assessed by telephone conversation  by this Nurse Health Advisor. No abnormalities found.      10/25/2021    4:10 PM  6CIT Screen  What Year? 0 points  What month? 0 points  What time? 0 points  Count back from 20 0 points  Months in reverse 0 points  Repeat phrase 0 points  Total Score 0 points    Immunizations Immunization History  Administered Date(s) Administered   PNEUMOCOCCAL CONJUGATE-20 09/04/2020   Tdap 04/06/2014    TDAP status: Up to date  Flu Vaccine status: Up to date  Pneumococcal vaccine status: Up to date  Covid-19 vaccine status: Completed vaccines  Qualifies for Shingles Vaccine? Yes   Zostavax completed No   Shingrix Completed?: No.    Education has been provided regarding the importance of this vaccine. Patient has been advised to call insurance company to determine out of pocket expense if they have not yet received this vaccine. Advised may also receive vaccine at local pharmacy or Health Dept. Verbalized acceptance and understanding.  Screening Tests Health Maintenance  Topic Date Due   COVID-19 Vaccine (1) Never done  INFLUENZA VACCINE  10/09/2021   Zoster Vaccines- Shingrix (1 of 2) 12/22/2021 (Originally  09/03/2004)   COLONOSCOPY (Pts 45-18yr Insurance coverage will need to be confirmed)  09/22/2022 (Originally 09/04/1999)   URINE MICROALBUMIN  09/22/2022   TETANUS/TDAP  04/06/2024   Pneumonia Vaccine 66 Years old  Completed   Hepatitis C Screening  Completed   HPV VACCINES  Aged Out    Health Maintenance  Health Maintenance Due  Topic Date Due   COVID-19 Vaccine (1) Never done   INFLUENZA VACCINE  10/09/2021    Colorectal cancer screening: Type of screening: Colonoscopy. Completed 10/01/2021. Repeat every 3 years  Lung Cancer Screening: (Low Dose CT Chest recommended if Age 67-80years, 30 pack-year currently smoking OR have quit w/in 15years.) does not qualify.   Lung Cancer Screening Referral: n/a  Additional Screening:  Hepatitis C Screening: does not qualify;   Vision Screening: Recommended annual ophthalmology exams for early detection of glaucoma and other disorders of the eye. Is the patient up to date with their annual eye exam?  Yes  Who is the provider or what is the name of the office in which the patient attends annual eye exams? Unknown  If pt is not established with a provider, would they like to be referred to a provider to establish care? No .   Dental Screening: Recommended annual dental exams for proper oral hygiene  Community Resource Referral / Chronic Care Management: CRR required this visit?  No   CCM required this visit?  No      Plan:     I have personally reviewed and noted the following in the patient's chart:   Medical and social history Use of alcohol, tobacco or illicit drugs  Current medications and supplements including opioid prescriptions. Patient is not currently taking opioid prescriptions. Functional ability and status Nutritional status Physical activity Advanced directives List of other physicians Hospitalizations, surgeries, and ER visits in previous 12 months Vitals Screenings to include cognitive, depression, and  falls Referrals and appointments  In addition, I have reviewed and discussed with patient certain preventive protocols, quality metrics, and best practice recommendations. A written personalized care plan for preventive services as well as general preventive health recommendations were provided to patient.     LDaphane Shepherd LPN   88/04/2334  Nurse Notes: none

## 2022-03-25 ENCOUNTER — Ambulatory Visit: Payer: Medicare Other | Admitting: Family Medicine
